# Patient Record
Sex: Female | Born: 1945
Health system: Southern US, Community
[De-identification: ages and names within clinical notes are randomized; demographics above are authoritative.]

## PROBLEM LIST (undated history)

## (undated) DIAGNOSIS — J189 Pneumonia, unspecified organism: Secondary | ICD-10-CM

## (undated) DIAGNOSIS — L659 Nonscarring hair loss, unspecified: Secondary | ICD-10-CM

## (undated) DIAGNOSIS — E559 Vitamin D deficiency, unspecified: Secondary | ICD-10-CM

## (undated) DIAGNOSIS — H269 Unspecified cataract: Secondary | ICD-10-CM

## (undated) DIAGNOSIS — K047 Periapical abscess without sinus: Secondary | ICD-10-CM

## (undated) DIAGNOSIS — H40009 Preglaucoma, unspecified, unspecified eye: Secondary | ICD-10-CM

## (undated) DIAGNOSIS — H209 Unspecified iridocyclitis: Secondary | ICD-10-CM

## (undated) DIAGNOSIS — K219 Gastro-esophageal reflux disease without esophagitis: Secondary | ICD-10-CM

## (undated) DIAGNOSIS — T7840XA Allergy, unspecified, initial encounter: Secondary | ICD-10-CM

## (undated) DIAGNOSIS — G5601 Carpal tunnel syndrome, right upper limb: Secondary | ICD-10-CM

## (undated) DIAGNOSIS — R011 Cardiac murmur, unspecified: Secondary | ICD-10-CM

## (undated) DIAGNOSIS — H35033 Hypertensive retinopathy, bilateral: Secondary | ICD-10-CM

## (undated) HISTORY — DX: Pneumonia, unspecified organism: J18.9

## (undated) HISTORY — DX: Nonscarring hair loss, unspecified: L65.9

## (undated) HISTORY — DX: Gastro-esophageal reflux disease without esophagitis: K21.9

## (undated) HISTORY — DX: Vitamin D deficiency, unspecified: E55.9

## (undated) HISTORY — DX: Hypertensive retinopathy, bilateral: H35.033

## (undated) HISTORY — DX: Allergy, unspecified, initial encounter: T78.40XA

## (undated) HISTORY — DX: Carpal tunnel syndrome, right upper limb: G56.01

## (undated) HISTORY — DX: Unspecified iridocyclitis: H20.9

## (undated) HISTORY — DX: Unspecified cataract: H26.9

## (undated) HISTORY — DX: Cardiac murmur, unspecified: R01.1

## (undated) HISTORY — DX: Preglaucoma, unspecified, unspecified eye: H40.009

---

## 1979-06-01 HISTORY — PX: APPENDECTOMY: SHX54

## 1997-05-31 HISTORY — PX: DIAGNOSTIC LAPAROSCOPY: SUR761

## 1998-05-31 HISTORY — PX: ABDOMINAL HYSTERECTOMY: SHX81

## 1998-08-26 ENCOUNTER — Other Ambulatory Visit: Admission: RE | Admit: 1998-08-26 | Discharge: 1998-08-26 | Payer: Self-pay | Admitting: Gynecology

## 1998-11-16 ENCOUNTER — Emergency Department (HOSPITAL_COMMUNITY): Admission: EM | Admit: 1998-11-16 | Discharge: 1998-11-16 | Payer: Self-pay | Admitting: Emergency Medicine

## 2001-03-17 ENCOUNTER — Encounter: Payer: Self-pay | Admitting: Gastroenterology

## 2001-03-17 ENCOUNTER — Encounter: Admission: RE | Admit: 2001-03-17 | Discharge: 2001-03-17 | Payer: Self-pay | Admitting: Gastroenterology

## 2001-06-26 ENCOUNTER — Other Ambulatory Visit: Admission: RE | Admit: 2001-06-26 | Discharge: 2001-06-26 | Payer: Self-pay | Admitting: Gynecology

## 2001-12-11 ENCOUNTER — Encounter: Payer: Self-pay | Admitting: Ophthalmology

## 2001-12-11 ENCOUNTER — Encounter: Admission: RE | Admit: 2001-12-11 | Discharge: 2001-12-11 | Payer: Self-pay | Admitting: Ophthalmology

## 2002-05-31 HISTORY — PX: BREAST SURGERY: SHX581

## 2002-07-02 ENCOUNTER — Other Ambulatory Visit: Admission: RE | Admit: 2002-07-02 | Discharge: 2002-07-02 | Payer: Self-pay | Admitting: Gynecology

## 2003-10-14 ENCOUNTER — Other Ambulatory Visit: Admission: RE | Admit: 2003-10-14 | Discharge: 2003-10-14 | Payer: Self-pay | Admitting: Gynecology

## 2004-10-13 ENCOUNTER — Other Ambulatory Visit: Admission: RE | Admit: 2004-10-13 | Discharge: 2004-10-13 | Payer: Self-pay | Admitting: Gynecology

## 2005-10-20 ENCOUNTER — Other Ambulatory Visit: Admission: RE | Admit: 2005-10-20 | Discharge: 2005-10-20 | Payer: Self-pay | Admitting: Gynecology

## 2006-05-16 LAB — HM DEXA SCAN

## 2006-10-27 ENCOUNTER — Other Ambulatory Visit: Admission: RE | Admit: 2006-10-27 | Discharge: 2006-10-27 | Payer: Self-pay | Admitting: Gynecology

## 2007-03-29 ENCOUNTER — Encounter: Admission: RE | Admit: 2007-03-29 | Discharge: 2007-03-29 | Payer: Self-pay | Admitting: General Surgery

## 2007-10-17 LAB — HM COLONOSCOPY

## 2007-10-30 ENCOUNTER — Other Ambulatory Visit: Admission: RE | Admit: 2007-10-30 | Discharge: 2007-10-30 | Payer: Self-pay | Admitting: Gynecology

## 2008-07-12 ENCOUNTER — Encounter: Admission: RE | Admit: 2008-07-12 | Discharge: 2008-07-12 | Payer: Self-pay | Admitting: Gastroenterology

## 2010-05-31 DIAGNOSIS — E559 Vitamin D deficiency, unspecified: Secondary | ICD-10-CM

## 2010-05-31 HISTORY — DX: Vitamin D deficiency, unspecified: E55.9

## 2010-12-30 LAB — HM MAMMOGRAPHY

## 2011-01-04 LAB — HM PAP SMEAR

## 2011-02-08 ENCOUNTER — Encounter: Payer: Self-pay | Admitting: Family Medicine

## 2011-02-08 ENCOUNTER — Encounter: Payer: Self-pay | Admitting: *Deleted

## 2011-02-08 ENCOUNTER — Ambulatory Visit (INDEPENDENT_AMBULATORY_CARE_PROVIDER_SITE_OTHER): Payer: BC Managed Care – PPO | Admitting: Family Medicine

## 2011-02-08 VITALS — BP 138/80 | HR 64 | Ht 66.5 in | Wt 150.0 lb

## 2011-02-08 DIAGNOSIS — R5383 Other fatigue: Secondary | ICD-10-CM

## 2011-02-08 DIAGNOSIS — Z23 Encounter for immunization: Secondary | ICD-10-CM

## 2011-02-08 DIAGNOSIS — R5381 Other malaise: Secondary | ICD-10-CM

## 2011-02-08 DIAGNOSIS — G5600 Carpal tunnel syndrome, unspecified upper limb: Secondary | ICD-10-CM | POA: Insufficient documentation

## 2011-02-08 DIAGNOSIS — M25569 Pain in unspecified knee: Secondary | ICD-10-CM

## 2011-02-08 DIAGNOSIS — Z0000 Encounter for general adult medical examination without abnormal findings: Secondary | ICD-10-CM

## 2011-02-08 DIAGNOSIS — E559 Vitamin D deficiency, unspecified: Secondary | ICD-10-CM | POA: Insufficient documentation

## 2011-02-08 DIAGNOSIS — M25561 Pain in right knee: Secondary | ICD-10-CM

## 2011-02-08 DIAGNOSIS — A6000 Herpesviral infection of urogenital system, unspecified: Secondary | ICD-10-CM | POA: Insufficient documentation

## 2011-02-08 DIAGNOSIS — E7800 Pure hypercholesterolemia, unspecified: Secondary | ICD-10-CM

## 2011-02-08 LAB — LIPID PANEL
Cholesterol: 212 mg/dL — ABNORMAL HIGH (ref 0–200)
HDL: 53 mg/dL (ref >39)
LDL Cholesterol: 131 mg/dL — ABNORMAL HIGH (ref 0–99)
Total CHOL/HDL Ratio: 4 Ratio
Triglycerides: 141 mg/dL (ref <150)
VLDL: 28 mg/dL (ref 0–40)

## 2011-02-08 LAB — CBC WITH DIFFERENTIAL/PLATELET
Basophils Absolute: 0 10*3/uL (ref 0.0–0.1)
Basophils Relative: 0 % (ref 0–1)
Eosinophils Relative: 1 % (ref 0–5)
HCT: 39.7 % (ref 36.0–46.0)
Hemoglobin: 13.5 g/dL (ref 12.0–15.0)
MCH: 31.8 pg (ref 26.0–34.0)
MCHC: 34 g/dL (ref 30.0–36.0)
MCV: 93.4 fL (ref 78.0–100.0)
Monocytes Absolute: 0.1 10*3/uL (ref 0.1–1.0)
Monocytes Relative: 2 % — ABNORMAL LOW (ref 3–12)
Neutro Abs: 4.4 10*3/uL (ref 1.7–7.7)
RDW: 13 % (ref 11.5–15.5)

## 2011-02-08 LAB — COMPREHENSIVE METABOLIC PANEL
ALT: 18 U/L (ref 0–35)
AST: 21 U/L (ref 0–37)
Alkaline Phosphatase: 78 U/L (ref 39–117)
BUN: 12 mg/dL (ref 6–23)
Calcium: 9.6 mg/dL (ref 8.4–10.5)
Chloride: 104 mEq/L (ref 96–112)
Creat: 0.77 mg/dL (ref 0.50–1.10)
Total Bilirubin: 0.6 mg/dL (ref 0.3–1.2)

## 2011-02-08 LAB — POCT URINALYSIS DIPSTICK
Blood, UA: NEGATIVE
Glucose, UA: NEGATIVE
Ketones, UA: NEGATIVE
Spec Grav, UA: 1.005
Urobilinogen, UA: NEGATIVE

## 2011-02-08 NOTE — Patient Instructions (Addendum)
HEALTH MAINTENANCE RECOMMENDATIONS:  It is recommended that you get at least 30 minutes of aerobic exercise at least 5 days/week (for weight loss, you may need as much as 60-90 minutes). This can be any activity that gets your heart rate up. This can be divided in 10-15 minute intervals if needed, but try and build up your endurance at least once a week.  Weight bearing exercise is also recommended twice weekly.  Eat a healthy diet with lots of vegetables, fruits and fiber.  "Colorful" foods have a lot of vitamins (ie green vegetables, tomatoes, red peppers, etc).  Limit sweet tea, regular sodas and alcoholic beverages, all of which has a lot of calories and sugar.  Up to 1 alcoholic drink daily may be beneficial for women (unless trying to lose weight, watch sugars).  Drink a lot of water.  Calcium recommendations are 1200-1500 mg daily (1500 mg for postmenopausal women or women without ovaries), and vitamin D 1000 IU daily.  This should be obtained from diet and/or supplements (vitamins), and calcium should not be taken all at once, but in divided doses.  Monthly self breast exams and yearly mammograms for women over the age of 68 is recommended.  Sunscreen of at least SPF 30 should be used on all sun-exposed parts of the skin when outside between the hours of 10 am and 4 pm (not just when at beach or pool, but even with exercise, golf, tennis, and yard work!)  Use a sunscreen that says "broad spectrum" so it covers both UVA and UVB rays, and make sure to reapply every 1-2 hours.  Remember to change the batteries in your smoke detectors when changing your clock times in the spring and fall.  Use your seat belt every time you are in a car, and please drive safely and not be distracted with cell phones and texting while driving.  Please check with your insurance regarding Zostavax (shingles vaccine) coverage.  If you are interested, you can call back to schedule a nurse visit
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
## 2011-03-01 ENCOUNTER — Other Ambulatory Visit (INDEPENDENT_AMBULATORY_CARE_PROVIDER_SITE_OTHER): Payer: BC Managed Care – PPO

## 2011-03-01 DIAGNOSIS — Z0000 Encounter for general adult medical examination without abnormal findings: Secondary | ICD-10-CM

## 2011-03-01 DIAGNOSIS — Z23 Encounter for immunization: Secondary | ICD-10-CM

## 2011-03-02 ENCOUNTER — Other Ambulatory Visit: Payer: BC Managed Care – PPO

## 2011-06-16 ENCOUNTER — Ambulatory Visit (INDEPENDENT_AMBULATORY_CARE_PROVIDER_SITE_OTHER): Payer: BC Managed Care – PPO | Admitting: Family Medicine

## 2011-06-16 ENCOUNTER — Encounter: Payer: Self-pay | Admitting: Family Medicine

## 2011-06-16 VITALS — BP 118/72 | HR 80 | Ht 66.5 in | Wt 150.0 lb

## 2011-06-16 DIAGNOSIS — M79609 Pain in unspecified limb: Secondary | ICD-10-CM

## 2011-06-16 DIAGNOSIS — M79604 Pain in right leg: Secondary | ICD-10-CM

## 2011-06-16 NOTE — Patient Instructions (Signed)
Trial of Aleve (1 pill twice daily, may increase to 2 pills twice daily if 1 pill isn't effective). Take it with food, and use it for up to 2 weeks, if needed. Exercise daily, and do stretches as shown daily.  Continue to drink plenty of fluids  Return if increasing pain with exertion, swelling, rash, or other new concerns develop, for re-evaluation
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
## 2011-09-04 ENCOUNTER — Emergency Department (HOSPITAL_COMMUNITY)
Admission: EM | Admit: 2011-09-04 | Discharge: 2011-09-04 | Disposition: A | Discharge status: Home / Self Care | Payer: BC Managed Care – PPO | Source: Home / Self Care | Attending: Emergency Medicine | Admitting: Emergency Medicine

## 2011-09-04 ENCOUNTER — Encounter (HOSPITAL_COMMUNITY): Payer: Self-pay | Admitting: *Deleted

## 2011-09-04 ENCOUNTER — Emergency Department (INDEPENDENT_AMBULATORY_CARE_PROVIDER_SITE_OTHER): Payer: BC Managed Care – PPO

## 2011-09-04 DIAGNOSIS — M1710 Unilateral primary osteoarthritis, unspecified knee: Secondary | ICD-10-CM

## 2011-09-04 DIAGNOSIS — IMO0002 Reserved for concepts with insufficient information to code with codable children: Secondary | ICD-10-CM

## 2011-09-04 HISTORY — DX: Periapical abscess without sinus: K04.7

## 2011-09-04 MED ORDER — MELOXICAM 7.5 MG PO TABS: 7.5000 mg | ORAL_TABLET | Freq: Every day | ORAL | Status: DC

## 2011-09-04 NOTE — ED Provider Notes (Signed)
History     CSN: [id number]  Arrival date & time 09/04/11  1615   First MD Initiated Contact with Patient 09/04/11 1619      Chief Complaint  Patient presents with  . Knee Pain  . Knee Injury    (Consider location/radiation/quality/duration/timing/severity/associated sxs/prior treatment) HPI Comments: Patient fell last week and she passed out, she describes that she was experiencing severe dental pain as he was developing an abscess was taken antibiotics. This fall occurred last Saturday she fell and hit her head and she suspected her right knee as her knee was swollen after the event (points to the medial aspect of her right knee). Patient describes that her pain has been persistent although the swelling has improved. She feels that when she straightens her knee feels somewhat better hurts more when she bends keeps pointing to medial aspect of right knee. It also hurts when he walks on it.  Patient is a 66 y.o. female presenting with knee pain. The history is provided by the patient.  Knee Pain This is a new problem. The current episode started more than 1 week ago. The problem occurs constantly. The problem has been gradually worsening. The symptoms are aggravated by bending, twisting and walking. The symptoms are relieved by rest. She has tried nothing for the symptoms. The treatment provided no relief.    Past Medical History  Diagnosis Date  . Heart murmur   . Pneumonia age 25    pt was hospitalized at age 76  . Iritis 2004, 2007    Dr. Mitzi Davenport, and [facility]; negative work-up  . Unspecified vitamin D deficiency 2012    treated by Dr. Chevis Pretty  . Genital herpes   . Carpal tunnel syndrome of right wrist   . Abscessed tooth     Past Surgical History  Procedure Date  . Diagnostic laparoscopy 1999  . Abdominal hysterectomy 2000    partial-ovaries remaining  . Appendectomy 1981  . Breast surgery 2004    breast reduction    Family History  Problem Relation Age of  Onset  . Hypertension Mother   . Diabetes Mother   . Stroke Mother   . Dementia Mother   . Cancer Mother     leukemia (chronic)  . Depression Mother   . Diabetes Father   . Hypertension Father   . Parkinsonism Father   . Hypertension Sister   . Hypertension Brother   . Stroke Brother   . Hypertension Brother   . Hyperlipidemia Brother     History  Substance Use Topics  . Smoking status: Never Smoker   . Smokeless tobacco: Never Used  . Alcohol Use: No     rarely, maybe once a year.    OB History    Grav Para Term Preterm Abortions TAB SAB Ect Mult Living                  Review of Systems  Constitutional: Negative for fever, activity change and appetite change.  Musculoskeletal: Positive for joint swelling. Negative for back pain, arthralgias and gait problem.  Neurological: Negative for weakness and numbness.    Allergies  Penicillins  Home Medications   Current Outpatient Rx  Name Route Sig Dispense Refill  . CLINDAMYCIN HCL 150 MG PO CAPS Oral Take 150 mg by mouth 3 (three) times daily.    Marland Kitchen CALCIUM CARBONATE-VITAMIN D 600-400 MG-UNIT PO TABS Oral Take 1 tablet by mouth daily.      . CENTRUM ULTRA  WOMENS PO Oral Take 1 tablet by mouth daily.      Marland Kitchen VALACYCLOVIR HCL 500 MG PO TABS Oral Take 500 mg by mouth as needed.        BP 185/83  Pulse 71  Temp(Src) 98.5 F (36.9 C) (Oral)  Resp 18  SpO2 100%  Physical Exam  Nursing note and vitals reviewed. Constitutional: She appears well-developed and well-nourished. No distress.  HENT:  Head: Normocephalic.  Eyes: Conjunctivae are normal.  Neck: Neck supple.  Musculoskeletal: She exhibits tenderness. She exhibits no edema.       Right knee: She exhibits bony tenderness. She exhibits normal range of motion, no effusion, no ecchymosis, no deformity, no erythema, normal alignment, normal meniscus and no MCL laxity. No MCL, no LCL and no patellar tendon tenderness noted.       Legs: Neurological: She is  alert.  Skin: Skin is warm. No rash noted. No erythema.    ED Course  Procedures (including critical care time)  Labs Reviewed - No data to display No results found.   No diagnosis found.    MDM  Right knee contusion and occurred 7 days ago. Patient medial knee pain exacerbated with weightbearing and direct palpation. Constitutional right knee. Symptomatic management knee sleeve followup with orthopedic provider as needed. Patient agree with treatment plan and followup care     Jimmie Molly, MD 09/04/11 1820

## 2011-09-04 NOTE — ED Notes (Signed)
Pt had syncopal episode x one week ago had abscess tooth -fell injured left side of head and right knee  With swelling post fall  --Taking pain med for abscess tooth completed pain med now pain right knee with movement and at rest

## 2011-09-04 NOTE — Discharge Instructions (Signed)
  We discussed her x-ray results today and necessary followup if your pain persists beyond 2 weeks   Wear and Tear Disorders of the Knee (Arthritis, Osteoarthritis) Everyone will experience wear and tear injuries (arthritis, osteoarthritis) of the knee. These are the changes we all get as we age. They come from the joint stress of daily living. The amount of cartilage damage in your knee and your symptoms determine if you need surgery. Mild problems require approximately two months recovery time. More severe problems take several months to recover. With mild problems, your surgeon may find worn and rough cartilage surfaces. With severe changes, your surgeon may find cartilage that has completely worn away and exposed the bone. Loose bodies of bone and cartilage, bone spurs (excess bone growth), and injuries to the menisci (cushions between the large bones of your leg) are also common. All of these problems can cause pain. For a mild wear and tear problem, rough cartilage may simply need to be shaved and smoothed. For more severe problems with areas of exposed bone, your surgeon may use an instrument for roughing up the bone surfaces to stimulate new cartilage growth. Loose bodies are usually removed. Torn menisci may be trimmed or repaired. ABOUT THE ARTHROSCOPIC PROCEDURE Arthroscopy is a surgical technique. It allows your orthopedic surgeon to diagnose and treat your knee injury with accuracy. The surgeon looks into your knee through a small scope. The scope is like a small (pencil-sized) telescope. Arthroscopy is less invasive than open knee surgery. You can expect a more rapid recovery. After the procedure, you will be moved to a recovery area until most of the effects of the medication have worn off. Your caregiver will discuss the test results with you. RECOVERY The severity of the arthritis and the type of procedure performed will determine recovery time. Other important factors include age, physical  condition, medical conditions, and the type of rehabilitation program. Strengthening your muscles after arthroscopy helps guarantee a better recovery. Follow your caregiver's instructions. Use crutches, rest, elevate, ice, and do knee exercises as instructed. Your caregivers will help you and instruct you with exercises and other physical therapy required to regain your mobility, muscle strength, and functioning following surgery. Only take over-the-counter or prescription medicines for pain, discomfort, or fever as directed by your caregiver.  SEEK MEDICAL CARE IF:   There is increased bleeding (more than a small spot) from the wound.   You notice redness, swelling, or increasing pain in the wound.   Pus is coming from wound.   You develop an unexplained oral temperature above 102 F (38.9 C) , or as your caregiver suggests.   You notice a foul smell coming from the wound or dressing.   You have severe pain with motion of the knee.  SEEK IMMEDIATE MEDICAL CARE IF:   You develop a rash.   You have difficulty breathing.   You have any allergic problems.  MAKE SURE YOU:   Understand these instructions.   Will watch your condition.   Will get help right away if you are not doing well or get worse.  Document Released: 05/14/2000 Document Revised: 05/06/2011 Document Reviewed: 10/11/2007 [facility] Patient Information 2012 [place], [place].

## 2011-09-08 ENCOUNTER — Encounter (HOSPITAL_COMMUNITY): Payer: Self-pay | Admitting: Cardiology

## 2011-09-08 ENCOUNTER — Emergency Department (INDEPENDENT_AMBULATORY_CARE_PROVIDER_SITE_OTHER)
Admission: EM | Admit: 2011-09-08 | Discharge: 2011-09-08 | Disposition: A | Discharge status: Home / Self Care | Payer: BC Managed Care – PPO | Source: Home / Self Care | Attending: Emergency Medicine | Admitting: Emergency Medicine

## 2011-09-08 DIAGNOSIS — T148XXA Other injury of unspecified body region, initial encounter: Secondary | ICD-10-CM

## 2011-09-08 NOTE — ED Provider Notes (Signed)
Chief Complaint  Patient presents with  . Joint Swelling    History of Present Illness:   Annette Monroe is a 66 year old female who noted swelling of her right ankle today. She denies any pain in the ankle or difficulty walking. She injured her medial right knee 10 days ago. She was having some severe dental pain and actually passed out. She was given some antibiotics, but did bang her knee and came here 5 days ago. The knee was x-rayed and this was negative. She was given anti-inflammatories and put in a knee sleeve which she thought was very tight. The knee is now better, but she noted the swelling in the ankle today.  Review of Systems:  Other than noted above, the patient denies any of the following symptoms: Systemic:  No fevers, chills, sweats, or aches.  No fatigue or tiredness. Musculoskeletal:  No joint pain, arthritis, bursitis, swelling, back pain, or neck pain. Neurological:  No muscular weakness, paresthesias, headache, or trouble with speech or coordination.  No dizziness.   PMFSH:  Past medical history, family history, social history, meds, and allergies were reviewed.  Physical Exam:   Vital signs:  BP 148/83  Pulse 78  Temp(Src) 98.6 F (37 C) (Oral)  Resp 16  SpO2 98% Gen:  Alert and oriented times 3.  In no distress. Musculoskeletal: Exam of the knee reveals no swelling, bruising, or deformity. The knee has a full range of motion without any pain. There is a little bit of crepitus. McMurray sign is negative, cruciate and collateral ligaments are intact. Exam of the ankle reveals a little bit of puffiness and some discoloration both medially and laterally, this extends to the foot. There is no erythema or warmth. There is absolutely no tenderness to palpation over the ankle and ankle joint has a full range of motion without any pain. Otherwise, all joints had a full a ROM with no swelling, bruising or deformity.  No edema, pulses full. Extremities were warm and pink.  Capillary  refill was brisk.  Skin:  Clear, warm and dry.  No rash. Neuro:  Alert and oriented times 3.  Muscle strength was normal.  Sensation was intact to light touch.   Medical Decision Making:  I think the swelling of the ankle and discoloration is due to bruising from the knee does travel down the leg and in the ankle. I think it will go away with time and all she needs to do is elevated and wear an Ace wrap. He is fairly asymptomatic in terms of any pain or discomfort, so I don't think she needs any further medication.  Assessment:  The encounter diagnosis was Hematoma.  Plan:   1.  The following meds were prescribed:   New Prescriptions   No medications on file   2.  The patient was instructed in symptomatic care, including rest and activity, elevation, application of ice and compression.  Appropriate handouts were given. 3.  The patient was told to return if becoming worse in any way, if no better in 3 or 4 days, and given some red flag symptoms that would indicate earlier return.   4.  The patient was told to follow up back here if she has any further problems such as increasing pain, erythema, increasing swelling, or any other problems.   Reuben Likes, MD 09/08/11 [phone]

## 2011-09-08 NOTE — Discharge Instructions (Signed)
Bone Bruise  A bone bruise is a small hidden fracture of the bone. It typically occurs with bones located close to the surface of the skin.  SYMPTOMS  The pain lasts longer than a normal bruise.   The bruised area is difficult to use.   There may be discoloration or swelling of the bruised area.   When a bone bruise is found with injury to the anterior cruciate ligament (in the knee) there is often an increased:   Amount of fluid in the knee   Time the fluid in the knee lasts.   Number of days until you are walking normally and regaining the motion you had before the injury.   Number of days with pain from the injury.  DIAGNOSIS  It can only be seen on X-rays known as MRIs. This stands for magnetic resonance imaging. A regular X-ray taken of a bone bruise would appear to be normal. A bone bruise is a common injury in the knee and the heel bone (calcaneus). The problems are similar to those produced by stress fractures, which are bone injuries caused by overuse. A bone bruise may also be a sign of other injuries. For example, bone bruises are commonly found where an anterior cruciate ligament (ACL) in the knee has been pulled away from the bone (ruptured). A ligament is a tough fibrous material that connects bones together to make our joints stable. Bruises of the bone last a lot longer than bruises of the muscle or tissues beneath the skin. Bone bruises can last from days to months and are often more severe and painful than other bruises. TREATMENT Because bone bruises are sudden injuries you cannot often prevent them, other than by being extremely careful. Some things you can do to improve the condition are:  Apply ice to the sore area for 15 to 20 minutes, 3 to 4 times per day while awake for the first 2 days. Put the ice in a plastic bag, and place a towel between the bag of ice and your skin.   Keep your bruised area raised (elevated) when possible to lessen swelling.   For activity:     Use crutches when necessary; do not put weight on the injured leg until you are no longer tender.   You may walk on your affected part as the pain allows, or as instructed.   Start weight bearing gradually on the bruised part.   Continue to use crutches or a cane until you can stand without causing pain, or as instructed.   If a plaster splint was applied, wear the splint until you are seen for a follow-up examination. Rest it on nothing harder than a pillow the first 24 hours. Do not put weight on it. Do not get it wet. You may take it off to take a shower or bath.   If an air splint was applied, more air may be blown into or out of the splint as needed for comfort. You may take it off at night and to take a shower or bath.   Wiggle your toes in the splint several times per day if you are able.   You may have been given an elastic bandage to use with the plaster splint or alone. The splint is too tight if you have numbness, tingling or if your foot becomes cold and blue. Adjust the bandage to make it comfortable.   Only take over-the-counter or prescription medicines for pain, discomfort, or fever as directed by   your caregiver.   Follow all instructions for follow up with your caregiver. This includes any orthopedic referrals, physical therapy, and rehabilitation. Any delay in obtaining necessary care could result in a delay or failure of the bones to heal.  SEEK MEDICAL CARE IF:   You have an increase in bruising, swelling, or pain.   You notice coldness of your toes.   You do not get pain relief with medications.  SEEK IMMEDIATE MEDICAL CARE IF:   Your toes are numb or blue.   You have severe pain not controlled with medications.   If any of the problems that caused you to seek care are becoming worse.  Document Released: 08/07/2003 Document Revised: 05/06/2011 Document Reviewed: 12/20/2007 [facility] Patient Information 2012 [place], [place].Hematoma A hematoma is a pocket of  blood that collects under the skin, in an organ, in a body space, in a joint space, or in other tissue. The blood can clot to form a lump that you can see and feel. The lump is often firm, sore, and sometimes even painful and tender. Most hematomas get better in a few days to weeks. However, some hematomas may be serious and require medical care.Hematomas can range in size from very small to very large. CAUSES  A hematoma can be caused by a blunt or penetrating injury. It can also be caused by leakage from a blood vessel under the skin. Spontaneous leakage from a blood vessel is more likely to occur in elderly people, especially those taking blood thinners. Sometimes, a hematoma can develop after certain medical procedures. SYMPTOMS  Unlike a bruise, a hematoma forms a firm lump that you can feel. This lump is the collection of blood. The collection of blood can also cause your skin to turn a blue to dark blue color. If the hematoma is close to the surface of the skin, it often produces a yellowish color in the skin. DIAGNOSIS  Your caregiver can determine whether you have a hematoma based on your history and a physical exam. TREATMENT  Hematomas usually go away on their own over time. Rarely does the blood need to be drained out of the body. HOME CARE INSTRUCTIONS   Put ice on the injured area.   Put ice in a plastic bag.   Place a towel between your skin and the bag.   Leave the ice on for 15 to 20 minutes, 3 to 4 times a day for the first 1 to 2 days.   After the first 2 days, switch to using warm compresses on the hematoma.   Elevate the injured area to help decrease pain and swelling. Wrapping the area with an elastic bandage may also be helpful. Compression helps to reduce swelling and promotes shrinking of the hematoma. Make sure the bandage is not wrapped too tight.   If your hematoma is on a lower extremity and is painful, crutches may be helpful for a couple days.   Only take  over-the-counter or prescription medicines for pain, discomfort, or fever as directed by your caregiver. Most patients can take acetaminophen or ibuprofen for the pain.  SEEK IMMEDIATE MEDICAL CARE IF:   You have increasing pain, or your pain is not controlled with medicine.   You have a fever.   You have worsening swelling or discoloration.   Your skin over the hematoma breaks or starts bleeding.  MAKE SURE YOU:   Understand these instructions.   Will watch your condition.   Will get help right away if  you are not doing well or get worse.  Document Released: 12/30/2003 Document Revised: 05/06/2011 Document Reviewed: 01/18/2011 [facility] Patient Information 2012 [place], [place].

## 2011-09-08 NOTE — ED Notes (Signed)
Pt reports right ankle swelling that started today. She had a fall with injury to her right knee this past Saturday. Pt denies pain to the ankle but it feels tingly in the ankle area. Swelling noted to be on the medial side of the right ankle. Denies calf tenderness.

## 2011-12-30 LAB — HM MAMMOGRAPHY: HM Mammogram: NORMAL

## 2011-12-30 LAB — HM PAP SMEAR: HM Pap smear: NORMAL

## 2012-01-11 ENCOUNTER — Encounter: Payer: Self-pay | Admitting: Family Medicine

## 2012-01-11 ENCOUNTER — Ambulatory Visit (INDEPENDENT_AMBULATORY_CARE_PROVIDER_SITE_OTHER): Payer: BC Managed Care – PPO | Admitting: Family Medicine

## 2012-01-11 VITALS — BP 114/70 | HR 72 | Wt 149.0 lb

## 2012-01-11 DIAGNOSIS — M67361 Transient synovitis, right knee: Secondary | ICD-10-CM

## 2012-01-11 DIAGNOSIS — M659 Synovitis and tenosynovitis, unspecified: Secondary | ICD-10-CM

## 2012-01-11 NOTE — Patient Instructions (Signed)
Take 2 Aleve twice per day. Come back here in 2 weeks. If you have trouble before that don't hesitate to call

## 2012-01-11 NOTE — Progress Notes (Signed)
  Subjective:    Patient ID: Annette Monroe, female    DOB: 18-Jul-1945, 65 y.o.   MRN: [id number]  HPI Approximately one week ago she woke up and noted some knee discomfort as well as swelling. No history of injury to the knee. No popping, locking or grinding. She has no difficulty sitting or going up or down steps. She did try Aleve 3 times a day for several days and did get some relief. She stopped the medication and now the symptoms are recurring. She does have a trip planned to [place] in September.   Review of Systems     Objective:   Physical Exam Small effusion is noted. No tenderness to palpation over the joint lines. McMurray's and anterior drawer negative. No tenderness palpation of the patella.       Assessment & Plan:   1. Transient synovitis of right knee    I will treat conservatively with 2 Aleve twice per day and return here in 2 weeks for recheck.

## 2012-01-18 ENCOUNTER — Encounter: Payer: Self-pay | Admitting: Internal Medicine

## 2012-01-26 ENCOUNTER — Ambulatory Visit
Admission: RE | Admit: 2012-01-26 | Discharge: 2012-01-26 | Disposition: A | Discharge status: Home / Self Care | Payer: BC Managed Care – PPO | Source: Ambulatory Visit | Attending: Family Medicine | Admitting: Family Medicine

## 2012-01-26 ENCOUNTER — Ambulatory Visit (INDEPENDENT_AMBULATORY_CARE_PROVIDER_SITE_OTHER): Payer: BC Managed Care – PPO | Admitting: Family Medicine

## 2012-01-26 VITALS — BP 110/70 | HR 66 | Wt 150.0 lb

## 2012-01-26 DIAGNOSIS — M25461 Effusion, right knee: Secondary | ICD-10-CM

## 2012-01-26 DIAGNOSIS — M25469 Effusion, unspecified knee: Secondary | ICD-10-CM

## 2012-01-26 NOTE — Progress Notes (Signed)
  Subjective:    Patient ID: Annette Monroe, female    DOB: 1945-06-26, 66 y.o.   MRN: [id number]  HPI She is here for recheck. She states that she is still having difficulty with swelling and discomfort but no popping locking or grinding. She now remembers that she indeed did have a previous history and was seen in the emergency room in April. That record was reviewed. An x-ray was taken at that time. Presently she does not complain of any other joints involved, redness, warmth or tenderness to palpation.   Review of Systems     Objective:   Physical Exam Effusion is noted in the right knee. It is not warm or tender. Full motion. No joint line tenderness. Negative anterior drawer and McMurray's testing.       Assessment & Plan:   1. Knee effusion, right  DG Knee Complete 4 Views Right   the x-ray did show an effusion with some chronic arthritic changes and an osteochondral lesion. I will refer her to orthopedics.

## 2012-01-28 ENCOUNTER — Telehealth: Payer: Self-pay | Admitting: Family Medicine

## 2012-01-28 NOTE — Telephone Encounter (Signed)
Patient called inquiring about her ortho. Referral in reference to her knee. CLS

## 2012-02-01 NOTE — Telephone Encounter (Signed)
SENT REFERRAL TO [facility]

## 2012-02-14 ENCOUNTER — Telehealth: Payer: Self-pay | Admitting: Family Medicine

## 2012-02-15 ENCOUNTER — Other Ambulatory Visit (INDEPENDENT_AMBULATORY_CARE_PROVIDER_SITE_OTHER): Payer: BC Managed Care – PPO

## 2012-02-15 DIAGNOSIS — Z23 Encounter for immunization: Secondary | ICD-10-CM

## 2012-02-15 MED ORDER — CIPROFLOXACIN HCL 500 MG PO TABS: 500.0000 mg | ORAL_TABLET | Freq: Two times a day (BID) | ORAL | Status: DC

## 2012-02-15 NOTE — Telephone Encounter (Signed)
done

## 2012-04-12 ENCOUNTER — Encounter: Payer: Self-pay | Admitting: Family Medicine

## 2012-04-12 ENCOUNTER — Ambulatory Visit (INDEPENDENT_AMBULATORY_CARE_PROVIDER_SITE_OTHER): Payer: BC Managed Care – PPO | Admitting: Family Medicine

## 2012-04-12 VITALS — BP 132/88 | HR 64 | Ht 66.5 in | Wt 155.0 lb

## 2012-04-12 DIAGNOSIS — M5412 Radiculopathy, cervical region: Secondary | ICD-10-CM

## 2012-04-12 DIAGNOSIS — M25519 Pain in unspecified shoulder: Secondary | ICD-10-CM

## 2012-04-12 DIAGNOSIS — M25511 Pain in right shoulder: Secondary | ICD-10-CM

## 2012-04-12 MED ORDER — NAPROXEN 500 MG PO TABS: 500.0000 mg | ORAL_TABLET | Freq: Two times a day (BID) | ORAL | Status: DC
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
## 2012-04-12 NOTE — Patient Instructions (Addendum)
Cervical Radiculopathy Cervical radiculopathy happens when a nerve in the neck is pinched or bruised by a slipped (herniated) disk or by arthritic changes in the bones of the cervical spine. This can occur due to an injury or as part of the normal aging process. Pressure on the cervical nerves can cause pain or numbness that runs from your neck all the way down into your arm and fingers. CAUSES  There are many possible causes, including:  Injury.  Muscle tightness in the neck from overuse.  Swollen, painful joints (arthritis).  Breakdown or degeneration in the bones and joints of the spine (spondylosis) due to aging.  Bone spurs that may develop near the cervical nerves. SYMPTOMS  Symptoms include pain, weakness, or numbness in the affected arm and hand. Pain can be severe or irritating. Symptoms may be worse when extending or turning the neck. DIAGNOSIS  Your caregiver will ask about your symptoms and do a physical exam. He or she may test your strength and reflexes. X-rays, CT scans, and MRI scans may be needed in cases of injury or if the symptoms do not go away after a period of time. Electromyography (EMG) or nerve conduction testing may be done to study how your nerves and muscles are working. TREATMENT  Your caregiver may recommend certain exercises to help relieve your symptoms. Cervical radiculopathy can, and often does, get better with time and treatment. If your problems continue, treatment options may include:  Wearing a soft collar for short periods of time.  Physical therapy to strengthen the neck muscles.  Medicines, such as nonsteroidal anti-inflammatory drugs (NSAIDs), oral corticosteroids, or spinal injections.  Surgery. Different types of surgery may be done depending on the cause of your problems. HOME CARE INSTRUCTIONS   Put ice on the affected area.  Put ice in a plastic bag.  Place a towel between your skin and the bag.  Leave the ice on for 15 to 20  minutes, 3 to 4 times a day or as directed by your caregiver.  If ice does not help, you can try using heat. Take a warm shower or bath, or use a hot water bottle as directed by your caregiver.  You may try a gentle neck and shoulder massage.  Use a flat pillow when you sleep.  Only take over-the-counter or prescription medicines for pain, discomfort, or fever as directed by your caregiver.  If physical therapy was prescribed, follow your caregiver's directions.  If a soft collar was prescribed, use it as directed. SEEK IMMEDIATE MEDICAL CARE IF:   Your pain gets much worse and cannot be controlled with medicines.  You have weakness or numbness in your hand, arm, face, or leg.  You have a high fever or a stiff, rigid neck.  You lose bowel or bladder control (incontinence).  You have trouble with walking, balance, or speaking. MAKE SURE YOU:   Understand these instructions.  Will watch your condition.  Will get help right away if you are not doing well or get worse. Document Released: 02/09/2001 Document Revised: 08/09/2011 Document Reviewed: 12/29/2010 [facility] Patient Information 2013 [place], [place].  Follow up in 2-3 weeks if not improving, sooner if worse.  Definitely let us know if you develop weakness, rash, or other concerns.  Take the naproxen with food. Call if you do not tolerate this medication.

## 2012-07-12 ENCOUNTER — Ambulatory Visit (INDEPENDENT_AMBULATORY_CARE_PROVIDER_SITE_OTHER): Payer: BC Managed Care – PPO | Admitting: Family Medicine

## 2012-07-12 ENCOUNTER — Encounter: Payer: Self-pay | Admitting: Family Medicine

## 2012-07-12 VITALS — BP 130/80 | HR 64 | Ht 67.0 in | Wt 158.0 lb

## 2012-07-12 DIAGNOSIS — R002 Palpitations: Secondary | ICD-10-CM | POA: Insufficient documentation

## 2012-07-12 DIAGNOSIS — E039 Hypothyroidism, unspecified: Secondary | ICD-10-CM

## 2012-07-12 DIAGNOSIS — R5383 Other fatigue: Secondary | ICD-10-CM

## 2012-07-12 DIAGNOSIS — L659 Nonscarring hair loss, unspecified: Secondary | ICD-10-CM

## 2012-07-12 DIAGNOSIS — R079 Chest pain, unspecified: Secondary | ICD-10-CM

## 2012-07-12 DIAGNOSIS — E559 Vitamin D deficiency, unspecified: Secondary | ICD-10-CM

## 2012-07-12 DIAGNOSIS — Z0000 Encounter for general adult medical examination without abnormal findings: Secondary | ICD-10-CM

## 2012-07-12 DIAGNOSIS — M1711 Unilateral primary osteoarthritis, right knee: Secondary | ICD-10-CM | POA: Insufficient documentation

## 2012-07-12 LAB — CBC WITH DIFFERENTIAL/PLATELET
Basophils Absolute: 0 10*3/uL (ref 0.0–0.1)
Eosinophils Relative: 1 % (ref 0–5)
HCT: 40.1 % (ref 36.0–46.0)
Hemoglobin: 13.8 g/dL (ref 12.0–15.0)
Lymphocytes Relative: 37 % (ref 12–46)
MCV: 92 fL (ref 78.0–100.0)
Monocytes Absolute: 0.4 10*3/uL (ref 0.1–1.0)
Monocytes Relative: 6 % (ref 3–12)
Neutro Abs: 3.7 10*3/uL (ref 1.7–7.7)
RDW: 13.3 % (ref 11.5–15.5)
WBC: 6.7 10*3/uL (ref 4.0–10.5)

## 2012-07-12 LAB — COMPREHENSIVE METABOLIC PANEL
AST: 21 U/L (ref 0–37)
Albumin: 4.2 g/dL (ref 3.5–5.2)
BUN: 13 mg/dL (ref 6–23)
Calcium: 9.7 mg/dL (ref 8.4–10.5)
Chloride: 104 mEq/L (ref 96–112)
Glucose, Bld: 76 mg/dL (ref 70–99)
Potassium: 4 mEq/L (ref 3.5–5.3)
Sodium: 139 mEq/L (ref 135–145)
Total Protein: 6.6 g/dL (ref 6.0–8.3)

## 2012-07-12 LAB — POCT URINALYSIS DIPSTICK
Bilirubin, UA: NEGATIVE
Blood, UA: NEGATIVE
Glucose, UA: NEGATIVE
Spec Grav, UA: 1.01

## 2012-07-12 LAB — LIPID PANEL
HDL: 54 mg/dL (ref >39)
LDL Cholesterol: 111 mg/dL — ABNORMAL HIGH (ref 0–99)
Triglycerides: 111 mg/dL (ref <150)

## 2012-07-12 LAB — TSH: TSH: 4.038 u[IU]/mL (ref 0.350–4.500)
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
## 2012-07-12 NOTE — Patient Instructions (Signed)
HEALTH MAINTENANCE RECOMMENDATIONS:  It is recommended that you get at least 30 minutes of aerobic exercise at least 5 days/week (for weight loss, you may need as much as 60-90 minutes). This can be any activity that gets your heart rate up. This can be divided in 10-15 minute intervals if needed, but try and build up your endurance at least once a week.  Weight bearing exercise is also recommended twice weekly.  Eat a healthy diet with lots of vegetables, fruits and fiber.  "Colorful" foods have a lot of vitamins (ie green vegetables, tomatoes, red peppers, etc).  Limit sweet tea, regular sodas and alcoholic beverages, all of which has a lot of calories and sugar.  Up to 1 alcoholic drink daily may be beneficial for women (unless trying to lose weight, watch sugars).  Drink a lot of water.  Calcium recommendations are 1200-1500 mg daily (1500 mg for postmenopausal women or women without ovaries), and vitamin D 1000 IU daily.  This should be obtained from diet and/or supplements (vitamins), and calcium should not be taken all at once, but in divided doses.  Monthly self breast exams and yearly mammograms for women over the age of 27 is recommended.  Sunscreen of at least SPF 30 should be used on all sun-exposed parts of the skin when outside between the hours of 10 am and 4 pm (not just when at beach or pool, but even with exercise, golf, tennis, and yard work!)  Use a sunscreen that says "broad spectrum" so it covers both UVA and UVB rays, and make sure to reapply every 1-2 hours.  Remember to change the batteries in your smoke detectors when changing your clock times in the spring and fall.  Use your seat belt every time you are in a car, and please drive safely and not be distracted with cell phones and texting while driving.   check with your insurance regarding coverage of Zostavax (shingles vaccine); call back to schedule nurse visit if interested.  Keep journal regarding chest symptoms--as  to whether symptoms are related to meals, certain foods, exertion/exercise.  Depending on what you find, may need further evaluation.  If exertional, need referral to cardiologist, stress test.  If related to foods--cut back on citrus/acidic foods, trial of prilosec x 2 weeks to see if symptoms resolve.  Follow up here within the month if persistent/ongoing symptoms  R knee pain--known arthritis.  Return if worsening pain for a cortisone shot.  For now, continue with anti-inflammatories as needed. Avoid exercises that increase pain.  Exercise bike, elliptical and water aerobics are less likely to cause pain (if you can't tolerate walking for exercise)

## 2012-07-14 ENCOUNTER — Other Ambulatory Visit: Payer: Self-pay | Admitting: Family Medicine

## 2012-07-14 ENCOUNTER — Telehealth: Payer: Self-pay | Admitting: Internal Medicine

## 2012-07-14 DIAGNOSIS — E559 Vitamin D deficiency, unspecified: Secondary | ICD-10-CM

## 2012-07-14 DIAGNOSIS — E039 Hypothyroidism, unspecified: Secondary | ICD-10-CM

## 2012-07-14 MED ORDER — ERGOCALCIFEROL 1.25 MG (50000 UT) PO CAPS: 50000.0000 [IU] | ORAL_CAPSULE | ORAL | Status: DC

## 2012-07-14 NOTE — Telephone Encounter (Signed)
Sent in 50,000 IU of Vitamin D for 8 weeks for pt

## 2012-08-07 ENCOUNTER — Other Ambulatory Visit: Payer: BC Managed Care – PPO

## 2012-08-28 ENCOUNTER — Encounter: Payer: Self-pay | Admitting: Family Medicine

## 2012-08-28 ENCOUNTER — Ambulatory Visit (INDEPENDENT_AMBULATORY_CARE_PROVIDER_SITE_OTHER): Payer: BC Managed Care – PPO | Admitting: Family Medicine

## 2012-08-28 VITALS — BP 128/82 | HR 68 | Temp 98.1°F | Ht 66.5 in | Wt 158.0 lb

## 2012-08-28 DIAGNOSIS — M545 Low back pain: Secondary | ICD-10-CM

## 2012-08-28 LAB — POCT URINALYSIS DIPSTICK
Leukocytes, UA: NEGATIVE
Protein, UA: NEGATIVE
Spec Grav, UA: 1.01
Urobilinogen, UA: NEGATIVE
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
## 2012-08-28 NOTE — Patient Instructions (Signed)
Low back pain, left-sided:  consistent with muscle spasm.  No evidence of pneumonia or kidney/bladder infection 2 Aleve with food twice daily (or 1 500mg  if you have any leftover prescription).  Take regular for 5-7 days or up to 10 days.  Try heat, massage and stretches Call for muscle relaxant prescription if ongoing muscle pain.  Let us know if pain is mostly during day or evening, to let us know if you want medication that is less sedating (ie Robaxin) vs more sedating (ie Flexeril).

## 2012-10-05 ENCOUNTER — Other Ambulatory Visit: Payer: BC Managed Care – PPO

## 2012-10-30 ENCOUNTER — Other Ambulatory Visit: Payer: BC Managed Care – PPO

## 2012-10-30 DIAGNOSIS — E039 Hypothyroidism, unspecified: Secondary | ICD-10-CM

## 2012-10-30 DIAGNOSIS — E559 Vitamin D deficiency, unspecified: Secondary | ICD-10-CM

## 2012-10-30 LAB — TSH: TSH: 3.953 u[IU]/mL (ref 0.350–4.500)

## 2013-01-04 ENCOUNTER — Other Ambulatory Visit (INDEPENDENT_AMBULATORY_CARE_PROVIDER_SITE_OTHER): Payer: BC Managed Care – PPO

## 2013-01-04 DIAGNOSIS — Z2911 Encounter for prophylactic immunotherapy for respiratory syncytial virus (RSV): Secondary | ICD-10-CM

## 2013-01-04 DIAGNOSIS — Z23 Encounter for immunization: Secondary | ICD-10-CM

## 2013-01-08 ENCOUNTER — Telehealth: Payer: Self-pay | Admitting: Internal Medicine

## 2013-01-08 NOTE — Telephone Encounter (Signed)
Pt has a shingle shot last week and states that the first day it was red and swollen and then over the weekend she states her arm aches and its sensitive and hard around the area. Pt did try to rub acholol on her spot to see if she could get some relief and would have some itching to it. Pt wants to know if this is common or what she should do

## 2013-01-08 NOTE — Telephone Encounter (Signed)
Pt aware.

## 2013-01-08 NOTE — Telephone Encounter (Signed)
Reassure her that this is not unusual and to use tylenol for reliefl

## 2013-03-19 ENCOUNTER — Other Ambulatory Visit (INDEPENDENT_AMBULATORY_CARE_PROVIDER_SITE_OTHER): Payer: Medicare Other

## 2013-03-19 DIAGNOSIS — Z23 Encounter for immunization: Secondary | ICD-10-CM | POA: Diagnosis not present

## 2013-04-10 DIAGNOSIS — H2510 Age-related nuclear cataract, unspecified eye: Secondary | ICD-10-CM | POA: Diagnosis not present

## 2013-05-09 DIAGNOSIS — H2510 Age-related nuclear cataract, unspecified eye: Secondary | ICD-10-CM | POA: Diagnosis not present

## 2013-08-20 ENCOUNTER — Ambulatory Visit (INDEPENDENT_AMBULATORY_CARE_PROVIDER_SITE_OTHER): Payer: Medicare Other | Admitting: Family Medicine

## 2013-08-20 ENCOUNTER — Encounter: Payer: Self-pay | Admitting: Family Medicine

## 2013-08-20 VITALS — BP 124/76 | HR 68 | Ht 67.0 in | Wt 151.0 lb

## 2013-08-20 DIAGNOSIS — R7989 Other specified abnormal findings of blood chemistry: Secondary | ICD-10-CM

## 2013-08-20 DIAGNOSIS — J309 Allergic rhinitis, unspecified: Secondary | ICD-10-CM

## 2013-08-20 DIAGNOSIS — R946 Abnormal results of thyroid function studies: Secondary | ICD-10-CM

## 2013-08-20 DIAGNOSIS — G253 Myoclonus: Secondary | ICD-10-CM

## 2013-08-20 DIAGNOSIS — Z0000 Encounter for general adult medical examination without abnormal findings: Secondary | ICD-10-CM | POA: Diagnosis not present

## 2013-08-20 DIAGNOSIS — E559 Vitamin D deficiency, unspecified: Secondary | ICD-10-CM | POA: Diagnosis not present

## 2013-08-20 DIAGNOSIS — Z23 Encounter for immunization: Secondary | ICD-10-CM | POA: Diagnosis not present

## 2013-08-20 DIAGNOSIS — E7800 Pure hypercholesterolemia, unspecified: Secondary | ICD-10-CM

## 2013-08-20 DIAGNOSIS — Z79899 Other long term (current) drug therapy: Secondary | ICD-10-CM

## 2013-08-20 LAB — POCT URINALYSIS DIPSTICK
BILIRUBIN UA: NEGATIVE
Blood, UA: NEGATIVE
Glucose, UA: NEGATIVE
Ketones, UA: NEGATIVE
LEUKOCYTES UA: NEGATIVE
NITRITE UA: NEGATIVE
PH UA: 5
PROTEIN UA: NEGATIVE
Spec Grav, UA: 1.01
Urobilinogen, UA: NEGATIVE

## 2013-08-20 NOTE — Patient Instructions (Addendum)
  HEALTH MAINTENANCE RECOMMENDATIONS:  It is recommended that you get at least 30 minutes of aerobic exercise at least 5 days/week (for weight loss, you may need as much as 60-90 minutes). This can be any activity that gets your heart rate up. This can be divided in 10-15 minute intervals if needed, but try and build up your endurance at least once a week.  Weight bearing exercise is also recommended twice weekly.  Eat a healthy diet with lots of vegetables, fruits and fiber.  "Colorful" foods have a lot of vitamins (ie green vegetables, tomatoes, red peppers, etc).  Limit sweet tea, regular sodas and alcoholic beverages, all of which has a lot of calories and sugar.  Up to 1 alcoholic drink daily may be beneficial for women (unless trying to lose weight, watch sugars).  Drink a lot of water.  Calcium recommendations are 1200-1500 mg daily (1500 mg for postmenopausal women or women without ovaries), and vitamin D 1000 IU daily.  This should be obtained from diet and/or supplements (vitamins), and calcium should not be taken all at once, but in divided doses.  Monthly self breast exams and yearly mammograms for women over the age of 70 is recommended.  Sunscreen of at least SPF 30 should be used on all sun-exposed parts of the skin when outside between the hours of 10 am and 4 pm (not just when at beach or pool, but even with exercise, golf, tennis, and yard work!)  Use a sunscreen that says "broad spectrum" so it covers both UVA and UVB rays, and make sure to reapply every 1-2 hours.  Remember to change the batteries in your smoke detectors when changing your clock times in the spring and fall.  Use your seat belt every time you are in a car, and please drive safely and not be distracted with cell phones and texting while driving.  Consider adding decongestant (ie sudafed, vs claritin D) if the plain claritin isn't adequate to control your allergies.  If this causes increased palpitations, then  consider using Flonase every day (nasal steroid).  Allergen avoidance (keep windows closed)  Trial of Rogaine for Women; consider following up with Dr. Allyson Sabal if worsening areas of hair loss.
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
## 2013-08-21 LAB — LIPID PANEL
CHOLESTEROL: 209 mg/dL — AB (ref 0–200)
HDL: 60 mg/dL (ref >39)
LDL Cholesterol: 128 mg/dL — ABNORMAL HIGH (ref 0–99)
Total CHOL/HDL Ratio: 3.5 Ratio
Triglycerides: 107 mg/dL (ref <150)
VLDL: 21 mg/dL (ref 0–40)

## 2013-08-21 LAB — COMPREHENSIVE METABOLIC PANEL
ALBUMIN: 4.1 g/dL (ref 3.5–5.2)
ALT: 14 U/L (ref 0–35)
AST: 16 U/L (ref 0–37)
Alkaline Phosphatase: 94 U/L (ref 39–117)
BUN: 14 mg/dL (ref 6–23)
CALCIUM: 9.7 mg/dL (ref 8.4–10.5)
CHLORIDE: 101 meq/L (ref 96–112)
CO2: 26 meq/L (ref 19–32)
Creat: 0.79 mg/dL (ref 0.50–1.10)
Glucose, Bld: 87 mg/dL (ref 70–99)
Potassium: 4 mEq/L (ref 3.5–5.3)
Sodium: 138 mEq/L (ref 135–145)
Total Bilirubin: 0.7 mg/dL (ref 0.2–1.2)
Total Protein: 6.7 g/dL (ref 6.0–8.3)

## 2013-08-21 LAB — TSH: TSH: 4.055 u[IU]/mL (ref 0.350–4.500)

## 2013-08-21 LAB — VITAMIN D 25 HYDROXY (VIT D DEFICIENCY, FRACTURES): Vit D, 25-Hydroxy: 32 ng/mL (ref 30–89)

## 2013-08-29 ENCOUNTER — Other Ambulatory Visit (INDEPENDENT_AMBULATORY_CARE_PROVIDER_SITE_OTHER): Payer: Medicare Other

## 2013-08-29 DIAGNOSIS — Z0000 Encounter for general adult medical examination without abnormal findings: Secondary | ICD-10-CM

## 2013-08-29 DIAGNOSIS — Z1211 Encounter for screening for malignant neoplasm of colon: Secondary | ICD-10-CM

## 2013-08-29 LAB — POC HEMOCCULT BLD/STL (HOME/3-CARD/SCREEN)
Card #2 Fecal Occult Blod, POC: NEGATIVE
FECAL OCCULT BLD: NEGATIVE
Fecal Occult Blood, POC: NEGATIVE

## 2013-10-26 ENCOUNTER — Encounter: Payer: Self-pay | Admitting: Medical

## 2013-10-26 ENCOUNTER — Ambulatory Visit (INDEPENDENT_AMBULATORY_CARE_PROVIDER_SITE_OTHER): Payer: Medicare Other | Admitting: Medical

## 2013-10-26 VITALS — BP 132/80 | HR 76 | Temp 97.8°F | Resp 14 | Wt 152.0 lb

## 2013-10-26 DIAGNOSIS — R51 Headache: Secondary | ICD-10-CM | POA: Diagnosis not present

## 2013-10-26 DIAGNOSIS — W57XXXA Bitten or stung by nonvenomous insect and other nonvenomous arthropods, initial encounter: Secondary | ICD-10-CM

## 2013-10-26 DIAGNOSIS — T148 Other injury of unspecified body region: Secondary | ICD-10-CM | POA: Diagnosis not present

## 2013-10-26 NOTE — Progress Notes (Signed)
Subjective: Was working in yard this past week, has raised deck, and as she was walking out, felt something sting her on the bridge of her nose.   Later when she went in her house, it became more painful and red.  Used anti-itch cream on it.  Since then it has continued to spread down the nose.   Has had persistent headache since 3 days ago.  Taking Aleve for this.  right eye is achy too.  She is worried about spider bite.  Doesn't think a bee stung her.  Denies fever, no NVD, no other URI symptoms, no other rash, no other symptom.  Takes Claritin daily for allergies.  No vision changes, no eye drainage.  Does have hx/o iritis 2 x in the past.  No hx/o migraine or headache problems.  No other c/o.   ROS as in subjective   Objective:  Filed Vitals:   10/26/13 1339  BP: 132/80  Pulse: 76  Temp: 97.8 F (36.6 C)  Resp: 14    General appearance: alert, no distress, WD/WN Skin: Between eyes at base of nose with slightly swollen 1.5 cm area, pink central area, but no obvious erythema, fluctuance, warmth, induration, or drainage.   Eyes: PERRLA, EOMi, conjunctiav and eyelids normal, no obvious lesions HENT unremarkable Neck: supple, no lymphadenopathy, no rash or mass   Assessment: Encounter Diagnoses  Name Primary?  . Insect bite Yes  . Headache(784.0)      Plan: Discussed findings, no obvious necrosis or worrisome signs of brown recluse bite.   Gave recommendations, and call/return right away if worrisome changes as discussed.   Specific recommendations today include:  Use ice pack to the area 1-2 times daily  Begin Benadryl OTC 25mg .  Use 1-2 tablets at bedtime.  Use 1/2-1 tablet up to 3 times daily over the weekend  Use either Aleve OTC 2 tablets twice daily or Ibuprofen OTC, 3 tablets 3 times daily this weekend  Consider either OTC Cortaid/hydrocortisone OR triple antibiotic ointment  This should gradually improve over the weekend  If worse, then recheck

## 2013-10-26 NOTE — Patient Instructions (Addendum)
  Thank you for giving me the opportunity to serve you today.    Your diagnosis today includes: Encounter Diagnoses  Name Primary?  . Insect bite Yes  . Headache(784.0)     Specific recommendations today include:  Use ice pack to the area 1-2 times daily  Begin Benadryl OTC 25mg .  Use 1-2 tablets at bedtime.  Use 1/2-1 tablet up to 3 times daily over the weekend  Use either Aleve OTC 2 tablets twice daily or Ibuprofen OTC, 3 tablets 3 times daily this weekend  Consider either OTC Cortaid/hydrocortisone OR triple antibiotic ointment  This should gradually improve over the weekend  If worse, then recheck   Follow up as needed

## 2013-10-29 ENCOUNTER — Encounter: Payer: Self-pay | Admitting: Family Medicine

## 2013-10-29 ENCOUNTER — Ambulatory Visit (INDEPENDENT_AMBULATORY_CARE_PROVIDER_SITE_OTHER): Payer: Medicare Other | Admitting: Family Medicine

## 2013-10-29 VITALS — BP 110/72 | HR 72 | Temp 97.9°F | Ht 67.0 in | Wt 154.5 lb

## 2013-10-29 DIAGNOSIS — L03211 Cellulitis of face: Secondary | ICD-10-CM

## 2013-10-29 DIAGNOSIS — W57XXXA Bitten or stung by nonvenomous insect and other nonvenomous arthropods, initial encounter: Secondary | ICD-10-CM | POA: Diagnosis not present

## 2013-10-29 DIAGNOSIS — L0201 Cutaneous abscess of face: Secondary | ICD-10-CM | POA: Diagnosis not present

## 2013-10-29 DIAGNOSIS — L259 Unspecified contact dermatitis, unspecified cause: Secondary | ICD-10-CM | POA: Diagnosis not present

## 2013-10-29 DIAGNOSIS — L309 Dermatitis, unspecified: Secondary | ICD-10-CM

## 2013-10-29 DIAGNOSIS — T148 Other injury of unspecified body region: Secondary | ICD-10-CM | POA: Diagnosis not present

## 2013-10-29 DIAGNOSIS — H20019 Primary iridocyclitis, unspecified eye: Secondary | ICD-10-CM | POA: Diagnosis not present

## 2013-10-29 MED ORDER — DOXYCYCLINE HYCLATE 100 MG PO TABS: 100.0000 mg | ORAL_TABLET | Freq: Two times a day (BID) | ORAL | Status: DC

## 2013-10-29 NOTE — Patient Instructions (Signed)
  Stop the peroxide. Stop the neosporin. Keep clean with soap and water. You may continue to use hydrocortisone cream twice daily. Take the antibiotics to treat possible underlying infection contributing to the drainage and spread.  Avoid the sun while on this antibiotic.
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
## 2013-11-07 DIAGNOSIS — H20019 Primary iridocyclitis, unspecified eye: Secondary | ICD-10-CM | POA: Diagnosis not present

## 2013-12-12 ENCOUNTER — Encounter: Payer: Medicare Other | Admitting: Family Medicine

## 2013-12-29 LAB — HM MAMMOGRAPHY

## 2014-01-30 DIAGNOSIS — Z1231 Encounter for screening mammogram for malignant neoplasm of breast: Secondary | ICD-10-CM | POA: Diagnosis not present

## 2014-02-13 DIAGNOSIS — Z01419 Encounter for gynecological examination (general) (routine) without abnormal findings: Secondary | ICD-10-CM | POA: Diagnosis not present

## 2014-02-13 DIAGNOSIS — N959 Unspecified menopausal and perimenopausal disorder: Secondary | ICD-10-CM | POA: Diagnosis not present

## 2014-02-13 DIAGNOSIS — Z1212 Encounter for screening for malignant neoplasm of rectum: Secondary | ICD-10-CM | POA: Diagnosis not present

## 2014-02-13 LAB — HM DEXA SCAN: HM Dexa Scan: NORMAL

## 2014-02-28 ENCOUNTER — Other Ambulatory Visit (INDEPENDENT_AMBULATORY_CARE_PROVIDER_SITE_OTHER): Payer: Medicare Other

## 2014-02-28 DIAGNOSIS — Z23 Encounter for immunization: Secondary | ICD-10-CM

## 2014-04-01 ENCOUNTER — Encounter: Payer: Self-pay | Admitting: Family Medicine

## 2014-04-11 ENCOUNTER — Encounter: Payer: Self-pay | Admitting: Family Medicine

## 2014-04-11 ENCOUNTER — Ambulatory Visit (INDEPENDENT_AMBULATORY_CARE_PROVIDER_SITE_OTHER): Payer: Medicare Other | Admitting: Family Medicine

## 2014-04-11 VITALS — BP 124/80 | HR 68 | Temp 96.3°F | Ht 67.0 in | Wt 152.0 lb

## 2014-04-11 DIAGNOSIS — R21 Rash and other nonspecific skin eruption: Secondary | ICD-10-CM

## 2014-04-11 NOTE — Progress Notes (Signed)
Chief Complaint  Patient presents with  . Rash    first appeared with a HA x 5 days ago, still has low grade HA. Noticed the rash Monday-itchy rash on her arms and shoulders and back.    She started 5 days ago with headaches, and feeling achey.  She noticed a rash on her arms 3 days ago.  She first noticed it on the left arm, and when further investigated, noticed it on her right arm, the tops of her shoulders, and possibly on her neckline of her back because it is itchy there.  She feels itchy during the day, when the clothes touch her skin--less itchy with loose fitting clothing.  Denies any change in products--soaps, detergents, fabric softeners, foods.  She has some ongoing dull headache--at the top of the head.  Achiness has resolved.  She has been taking tylenol for the headaches, only helped partially with headaches at first, seemed to work better yesterday. She had some symptoms in her right eye (like something was in it), similar to when she has had iritis in the past, but it hasn't progressed at all.  PMH, PSH, SH reviewed.  Outpatient Encounter Prescriptions as of 04/11/2014  Medication Sig Note  . loratadine (CLARITIN) 10 MG tablet Take 10 mg by mouth daily. 04/11/2014: Takes as needed in the spring for allergies  . Multiple Vitamins-Minerals (CENTRUM ULTRA WOMENS PO) Take 1 tablet by mouth daily.     Marland Kitchen acetaminophen (TYLENOL) 325 MG tablet Take 650 mg by mouth every 6 (six) hours as needed.   . naproxen sodium (ANAPROX) 220 MG tablet Take 440 mg by mouth as needed. 04/11/2014: Uses prn knee--hasn't needed to use in a while  . valACYclovir (VALTREX) 500 MG tablet Take 500 mg by mouth as needed.   10/26/2013: PRN   . [DISCONTINUED] doxycycline (VIBRA-TABS) 100 MG tablet Take 1 tablet (100 mg total) by mouth 2 (two) times daily.     Allergies  Allergen Reactions  . Benadryl [Diphenhydramine]     Gives her palpitations  . Penicillins Hives    ROS: Denies fevers, chills, denies  URI symptoms, sore throat, cough, shortness of breath, wheezing, chest pain.  Denies nausea, vomiting, diarrhea, or urinary complaints. Denies athletes foot or other rashes.  Denies numbness, tingling, weakness  PHYSICAL EXAM: BP 124/80 mmHg  Pulse 68  Temp(Src) 96.3 F (35.7 C) (Tympanic)  Ht 5\' 7"  (1.702 m)  Wt 152 lb (68.947 kg)  BMI 23.80 kg/m2 Well developed, pleasant female in no distress HEENT:  PERRL, conjunctiva clear. OP is clear without erythema Neck: no lymphadenopathy, thyromegaly or mass Heart: regular rate and rhythm Lungs: clear bilaterally Skin: very small, scattered papules noted on arms (left anterior shoulder, and a few on anterior left forearm; on right side, noted medially/anterior on forearm, shoulder--just a few scattered).  Back of neck--skin is pretty smooth over area that itches, just 2-3 very small papules noted. The papules do not have umbilication, not vesicular. No erythema. Skin is well moisturized.  ASSESSMENT/PLAN:  Rash and nonspecific skin eruption - nondiagnostic.  Suspect viral etiology.  supportive treatment. f/u if symptoms worsen for re-eval   Nonspecific rash, suspect viral etiology given prodrome of headache and achiness. Recommend symptomatic treatment of the itching--antihistamines such as claritin or zyrtec, +/- benadryl if needed, along with hydrocortisone cream as needed for itching.  Return for re-evaluation if new symptoms develop

## 2014-04-11 NOTE — Patient Instructions (Signed)
  Nonspecific rash, suspect viral etiology given prodrome of headache and achiness. Recommend symptomatic treatment of the itching--antihistamines such as claritin or zyrtec, +/- benadryl if needed, along with hydrocortisone cream as needed for itching.  Return for re-evaluation if new symptoms develop

## 2014-04-16 DIAGNOSIS — H1010 Acute atopic conjunctivitis, unspecified eye: Secondary | ICD-10-CM | POA: Diagnosis not present

## 2014-04-16 DIAGNOSIS — H01009 Unspecified blepharitis unspecified eye, unspecified eyelid: Secondary | ICD-10-CM | POA: Diagnosis not present

## 2014-04-16 DIAGNOSIS — H04123 Dry eye syndrome of bilateral lacrimal glands: Secondary | ICD-10-CM | POA: Diagnosis not present

## 2014-08-22 ENCOUNTER — Ambulatory Visit
Admission: RE | Admit: 2014-08-22 | Discharge: 2014-08-22 | Disposition: A | Discharge status: Home / Self Care | Payer: Medicare Other | Source: Ambulatory Visit | Attending: Family Medicine | Admitting: Family Medicine

## 2014-08-22 ENCOUNTER — Encounter: Payer: Self-pay | Admitting: Family Medicine

## 2014-08-22 ENCOUNTER — Ambulatory Visit (INDEPENDENT_AMBULATORY_CARE_PROVIDER_SITE_OTHER): Payer: Medicare Other | Admitting: Family Medicine

## 2014-08-22 VITALS — BP 132/82 | HR 68 | Ht 67.5 in | Wt 154.8 lb

## 2014-08-22 DIAGNOSIS — R5383 Other fatigue: Secondary | ICD-10-CM | POA: Diagnosis not present

## 2014-08-22 DIAGNOSIS — R0789 Other chest pain: Secondary | ICD-10-CM | POA: Diagnosis not present

## 2014-08-22 DIAGNOSIS — E559 Vitamin D deficiency, unspecified: Secondary | ICD-10-CM | POA: Diagnosis not present

## 2014-08-22 DIAGNOSIS — E7800 Pure hypercholesterolemia, unspecified: Secondary | ICD-10-CM

## 2014-08-22 DIAGNOSIS — J309 Allergic rhinitis, unspecified: Secondary | ICD-10-CM

## 2014-08-22 DIAGNOSIS — Z23 Encounter for immunization: Secondary | ICD-10-CM | POA: Diagnosis not present

## 2014-08-22 DIAGNOSIS — Z7189 Other specified counseling: Secondary | ICD-10-CM | POA: Diagnosis not present

## 2014-08-22 DIAGNOSIS — Z0000 Encounter for general adult medical examination without abnormal findings: Secondary | ICD-10-CM | POA: Diagnosis not present

## 2014-08-22 DIAGNOSIS — I700 Atherosclerosis of aorta: Secondary | ICD-10-CM | POA: Diagnosis not present

## 2014-08-22 LAB — COMPREHENSIVE METABOLIC PANEL
ALBUMIN: 4.2 g/dL (ref 3.5–5.2)
ALT: 18 U/L (ref 0–35)
AST: 19 U/L (ref 0–37)
Alkaline Phosphatase: 85 U/L (ref 39–117)
BUN: 14 mg/dL (ref 6–23)
CO2: 27 mEq/L (ref 19–32)
Calcium: 9.2 mg/dL (ref 8.4–10.5)
Chloride: 100 mEq/L (ref 96–112)
Creat: 0.79 mg/dL (ref 0.50–1.10)
Glucose, Bld: 69 mg/dL — ABNORMAL LOW (ref 70–99)
Potassium: 4 mEq/L (ref 3.5–5.3)
Sodium: 136 mEq/L (ref 135–145)
TOTAL PROTEIN: 6.7 g/dL (ref 6.0–8.3)
Total Bilirubin: 0.7 mg/dL (ref 0.2–1.2)

## 2014-08-22 LAB — CBC WITH DIFFERENTIAL/PLATELET
BASOS PCT: 0 % (ref 0–1)
Basophils Absolute: 0 10*3/uL (ref 0.0–0.1)
EOS PCT: 1 % (ref 0–5)
Eosinophils Absolute: 0.1 10*3/uL (ref 0.0–0.7)
HEMATOCRIT: 41.2 % (ref 36.0–46.0)
Hemoglobin: 14 g/dL (ref 12.0–15.0)
Lymphocytes Relative: 37 % (ref 12–46)
Lymphs Abs: 2 10*3/uL (ref 0.7–4.0)
MCH: 31.6 pg (ref 26.0–34.0)
MCHC: 34 g/dL (ref 30.0–36.0)
MCV: 93 fL (ref 78.0–100.0)
MONO ABS: 0.3 10*3/uL (ref 0.1–1.0)
MPV: 8.5 fL — ABNORMAL LOW (ref 8.6–12.4)
Monocytes Relative: 5 % (ref 3–12)
NEUTROS ABS: 3.1 10*3/uL (ref 1.7–7.7)
Neutrophils Relative %: 57 % (ref 43–77)
Platelets: 302 10*3/uL (ref 150–400)
RBC: 4.43 MIL/uL (ref 3.87–5.11)
RDW: 13.2 % (ref 11.5–15.5)
WBC: 5.5 10*3/uL (ref 4.0–10.5)

## 2014-08-22 LAB — LIPID PANEL
CHOL/HDL RATIO: 4.4 ratio
CHOLESTEROL: 219 mg/dL — AB (ref 0–200)
HDL: 50 mg/dL (ref >=46)
LDL CALC: 149 mg/dL — AB (ref 0–99)
TRIGLYCERIDES: 99 mg/dL (ref <150)
VLDL: 20 mg/dL (ref 0–40)
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
## 2014-08-22 NOTE — Patient Instructions (Addendum)
  HEALTH MAINTENANCE RECOMMENDATIONS:  It is recommended that you get at least 30 minutes of aerobic exercise at least 5 days/week (for weight loss, you may need as much as 60-90 minutes). This can be any activity that gets your heart rate up. This can be divided in 10-15 minute intervals if needed, but try and build up your endurance at least once a week.  Weight bearing exercise is also recommended twice weekly.  Eat a healthy diet with lots of vegetables, fruits and fiber.  "Colorful" foods have a lot of vitamins (ie green vegetables, tomatoes, red peppers, etc).  Limit sweet tea, regular sodas and alcoholic beverages, all of which has a lot of calories and sugar.  Up to 1 alcoholic drink daily may be beneficial for women (unless trying to lose weight, watch sugars).  Drink a lot of water.  Calcium recommendations are 1200-1500 mg daily (1500 mg for postmenopausal women or women without ovaries), and vitamin D 1000 IU daily.  This should be obtained from diet and/or supplements (vitamins), and calcium should not be taken all at once, but in divided doses.  Monthly self breast exams and yearly mammograms for women over the age of 71 is recommended.  Sunscreen of at least SPF 30 should be used on all sun-exposed parts of the skin when outside between the hours of 10 am and 4 pm (not just when at beach or pool, but even with exercise, golf, tennis, and yard work!)  Use a sunscreen that says "broad spectrum" so it covers both UVA and UVB rays, and make sure to reapply every 1-2 hours.  Remember to change the batteries in your smoke detectors when changing your clock times in the spring and fall.  Use your seat belt every time you are in a car, and please drive safely and not be distracted with cell phones and texting while driving.  Please go to [facility] for chest x-ray. Please see if Dr. Carren Rang would send copies of your mammogram and bone density results so we can enter into your [facility] records.  Consider Mucinex instead of Robitussin if x-ray is normal. If ongoing chest pressure/tightness, we may need to evaluate further (ie pulmonary function tests, etc).

## 2014-08-23 ENCOUNTER — Encounter: Payer: Self-pay | Admitting: Family Medicine

## 2014-08-23 LAB — TSH: TSH: 3.018 u[IU]/mL (ref 0.350–4.500)

## 2014-08-23 LAB — VITAMIN D 25 HYDROXY (VIT D DEFICIENCY, FRACTURES): Vit D, 25-Hydroxy: 20 ng/mL — ABNORMAL LOW (ref 30–100)

## 2014-08-23 MED ORDER — VITAMIN D (ERGOCALCIFEROL) 1.25 MG (50000 UNIT) PO CAPS: 50000.0000 [IU] | ORAL_CAPSULE | ORAL | Status: DC

## 2014-08-23 NOTE — Addendum Note (Signed)
Addended by: Rita Ohara on: 08/23/2014 09:41 AM   Modules accepted: Orders

## 2014-08-29 DIAGNOSIS — H01009 Unspecified blepharitis unspecified eye, unspecified eyelid: Secondary | ICD-10-CM | POA: Diagnosis not present

## 2014-08-29 DIAGNOSIS — H2513 Age-related nuclear cataract, bilateral: Secondary | ICD-10-CM | POA: Diagnosis not present

## 2014-08-29 DIAGNOSIS — H35033 Hypertensive retinopathy, bilateral: Secondary | ICD-10-CM | POA: Diagnosis not present

## 2014-08-29 DIAGNOSIS — H40013 Open angle with borderline findings, low risk, bilateral: Secondary | ICD-10-CM | POA: Diagnosis not present

## 2014-11-09 ENCOUNTER — Other Ambulatory Visit: Payer: Self-pay | Admitting: Family Medicine

## 2014-11-25 ENCOUNTER — Ambulatory Visit (INDEPENDENT_AMBULATORY_CARE_PROVIDER_SITE_OTHER): Payer: Medicare Other | Admitting: Family Medicine

## 2014-11-25 ENCOUNTER — Encounter: Payer: Self-pay | Admitting: Family Medicine

## 2014-11-25 VITALS — BP 140/84 | HR 80 | Temp 97.8°F | Wt 156.4 lb

## 2014-11-25 DIAGNOSIS — R42 Dizziness and giddiness: Secondary | ICD-10-CM | POA: Diagnosis not present

## 2014-11-25 NOTE — Patient Instructions (Signed)
I suspect that you have a mild case of vertigo.  This may be related to a virus, or related to inner ear from your travels.  I do not see any evidence of an infection or of a stroke.  Use meclizine 12.5-25mg  three times daily if needed for this dizzy feeling.  This is available without a prescription (under the name Bonine and others, ask the pharmacist).  Return if persistent/worsening dizziness or any other new associated problems.  Go to ER if any weakness, severe headache with vomiting, or other stroke-like synptoms.  Vertigo Vertigo means you feel like you or your surroundings are moving when they are not. Vertigo can be dangerous if it occurs when you are at work, driving, or performing difficult activities.  CAUSES  Vertigo occurs when there is a conflict of signals sent to your brain from the visual and sensory systems in your body. There are many different causes of vertigo, including:  Infections, especially in the inner ear.  A bad reaction to a drug or misuse of alcohol and medicines.  Withdrawal from drugs or alcohol.  Rapidly changing positions, such as lying down or rolling over in bed.  A migraine headache.  Decreased blood flow to the brain.  Increased pressure in the brain from a head injury, infection, tumor, or bleeding. SYMPTOMS  You may feel as though the world is spinning around or you are falling to the ground. Because your balance is upset, vertigo can cause nausea and vomiting. You may have involuntary eye movements (nystagmus). DIAGNOSIS  Vertigo is usually diagnosed by physical exam. If the cause of your vertigo is unknown, your caregiver may perform imaging tests, such as an MRI scan (magnetic resonance imaging). TREATMENT  Most cases of vertigo resolve on their own, without treatment. Depending on the cause, your caregiver may prescribe certain medicines. If your vertigo is related to body position issues, your caregiver may recommend movements or procedures  to correct the problem. In rare cases, if your vertigo is caused by certain inner ear problems, you may need surgery. HOME CARE INSTRUCTIONS   Follow your caregiver's instructions.  Avoid driving.  Avoid operating heavy machinery.  Avoid performing any tasks that would be dangerous to you or others during a vertigo episode.  Tell your caregiver if you notice that certain medicines seem to be causing your vertigo. Some of the medicines used to treat vertigo episodes can actually make them worse in some people. SEEK IMMEDIATE MEDICAL CARE IF:   Your medicines do not relieve your vertigo or are making it worse.  You develop problems with talking, walking, weakness, or using your arms, hands, or legs.  You develop severe headaches.  Your nausea or vomiting continues or gets worse.  You develop visual changes.  A family member notices behavioral changes.  Your condition gets worse. MAKE SURE YOU:  Understand these instructions.  Will watch your condition.  Will get help right away if you are not doing well or get worse. Document Released: 02/24/2005 Document Revised: 08/09/2011 Document Reviewed: 12/03/2010 [facility] Patient Information 2015 [place], [place]. This information is not intended to replace advice given to you by your health care provider. Make sure you discuss any questions you have with your health care provider.

## 2014-11-25 NOTE — Progress Notes (Signed)
Chief Complaint  Patient presents with  . possible vertigo    possible vertigo. aching at back of neck. starting having issues saturday. having dizziness, nausea, whoozing head feeling- buzzing in ear   Two days ago, later in the day, she started feeling dizzy, described as feeling like being on a cruise ship.  Yesterday she got up to go to the bathroom, and she felt like she was staggering to the bathroom, and the dizziness was much worse.  Looking at her knobs in the bathroom, they looked like they were moving.  Throughout the day, she continued to have ongoing dizziness, along with a headache.  Headache was posterior, and into her neck, across the whole back of the neck, like her head was too heavy for her neck.  She had some associated nausea.  She took Aleve, which helped reduce the headache.  This morning she felt better, just very dull ache in her "general head" (whole head feels "dull"), no longer having the neck pain or posterior headache. She still has mild sensation of equilibrium being off, only slight nausea.  Overall, the dizziness today is much better than it was yesterday. Dizziness is not positional--occurs with any position, worse when she moves her head quickly, such as shaking her head "no" to answer my questions.  She reports that she had a similar sensation one day while she was on vacation last week she felt a little dizzy, but was better by the next morning. She had traveled to [place]--including plane flights and train ride x2 days from [place] to [place].  She returned from trip 6/22. On 6/23 she started with scratchy throat and feeling like a cold was coming, but cold symptoms never truly developed.  Her ears had felt a little more sensitive on the flight back (sensitive to the pressure changes).  Denies fevers, chills.  Tinnitus is chronic and unchanged (normally fluctuates some).  Denies ear pain or hearing loss.  Denies any URI or allergy symptoms.  PMH, PSH, SH reviewed.  Outpatient  Encounter Prescriptions as of 11/25/2014  Medication Sig Note  . Multiple Vitamins-Minerals (CENTRUM ULTRA WOMENS PO) Take 1 tablet by mouth daily.     . naproxen sodium (ANAPROX) 220 MG tablet Take 440 mg by mouth as needed. 04/11/2014: Uses prn knee--hasn't needed to use in a while  . acetaminophen (TYLENOL) 325 MG tablet Take 650 mg by mouth every 6 (six) hours as needed.   . loratadine (CLARITIN) 10 MG tablet Take 10 mg by mouth daily. 04/11/2014: Takes as needed in the spring for allergies  . valACYclovir (VALTREX) 500 MG tablet Take 500 mg by mouth as needed.   10/26/2013: PRN   . Vitamin D, Ergocalciferol, (DRISDOL) 50000 UNITS CAPS capsule Take 1 capsule (50,000 Units total) by mouth every 7 (seven) days. (Patient not taking: Reported on 11/25/2014)    No facility-administered encounter medications on file as of 11/25/2014.   Allergies  Allergen Reactions  . Benadryl [Diphenhydramine]     Gives her palpitations  . Penicillins Hives   ROS:   No fever, chills, lightheadedness, syncope. No numbness/tingling/weakness, memory or speech difficulty (just usual carpal tunnel symptoms in her right arm, related to sleep position sometimes).  No chest pain, palpitations.  Has ongoing issue with intermittent barking cough, unchanged She had CXR in March to eval for complaint of cough and heaviness--normal (except atherosclerosis). No shortness of breath.  PHYSICAL EXAM: BP 140/84 mmHg  Pulse 80  Temp(Src) 97.8 F (36.6 C) (Tympanic)  Wt 156 lb 6.4 oz (70.943 kg)  Well developed, pleasant, well appearing female in no distress HEENT: PERRL, EOMI, conjunctiva clear.  TM's and EAC's normal--slight area of erythema/abrasion on left ear canal. TM's completely normal. Fundi benign OP is clear. Nasal mucosa appears normal. Sinuses are nontender c-spine is nontender, head is nontender Heart: regular rate.  Rhythm sounds like bigeminy, very regular. No murmur, rub, gallop Lungs: clear  bilaterally Neuro: alert and oriented. Cranial nerves intact. Normal strength, sensation, no pronator drift. Normal finger to nose. Normal gait. No nystagmus. DTR's 2+ and symmetric.  ASSESSMENT/PLAN:  Vertigo  Ddx reviewed extensively.  Suggest possibly viral.  No evidence of infection, tumor or stroke. Return if persistent or worsening symptoms new neuro symptoms develop. Trial of OTC meclizine.  Risks/side effects reviewed.

## 2014-11-28 DIAGNOSIS — H40013 Open angle with borderline findings, low risk, bilateral: Secondary | ICD-10-CM | POA: Diagnosis not present

## 2014-12-25 ENCOUNTER — Encounter: Payer: Self-pay | Admitting: *Deleted

## 2015-02-17 DIAGNOSIS — Z1212 Encounter for screening for malignant neoplasm of rectum: Secondary | ICD-10-CM | POA: Diagnosis not present

## 2015-02-17 DIAGNOSIS — Z124 Encounter for screening for malignant neoplasm of cervix: Secondary | ICD-10-CM | POA: Diagnosis not present

## 2015-02-17 DIAGNOSIS — Z6824 Body mass index (BMI) 24.0-24.9, adult: Secondary | ICD-10-CM | POA: Diagnosis not present

## 2015-03-07 DIAGNOSIS — Z1231 Encounter for screening mammogram for malignant neoplasm of breast: Secondary | ICD-10-CM | POA: Diagnosis not present

## 2015-03-07 LAB — HM MAMMOGRAPHY: HM Mammogram: NORMAL

## 2015-03-10 ENCOUNTER — Other Ambulatory Visit (INDEPENDENT_AMBULATORY_CARE_PROVIDER_SITE_OTHER): Payer: Medicare Other

## 2015-03-10 DIAGNOSIS — Z23 Encounter for immunization: Secondary | ICD-10-CM | POA: Diagnosis not present

## 2015-03-11 ENCOUNTER — Encounter: Payer: Self-pay | Admitting: *Deleted

## 2015-04-02 ENCOUNTER — Encounter: Payer: Self-pay | Admitting: Family Medicine

## 2015-04-02 ENCOUNTER — Ambulatory Visit (INDEPENDENT_AMBULATORY_CARE_PROVIDER_SITE_OTHER): Payer: Medicare Other | Admitting: Family Medicine

## 2015-04-02 VITALS — BP 140/86 | HR 72 | Ht 67.5 in | Wt 152.8 lb

## 2015-04-02 DIAGNOSIS — E7800 Pure hypercholesterolemia, unspecified: Secondary | ICD-10-CM | POA: Diagnosis not present

## 2015-04-02 DIAGNOSIS — A6000 Herpesviral infection of urogenital system, unspecified: Secondary | ICD-10-CM

## 2015-04-02 DIAGNOSIS — I700 Atherosclerosis of aorta: Secondary | ICD-10-CM

## 2015-04-02 DIAGNOSIS — E559 Vitamin D deficiency, unspecified: Secondary | ICD-10-CM

## 2015-04-02 MED ORDER — VALACYCLOVIR HCL 500 MG PO TABS
ORAL_TABLET | ORAL | Status: DC
Start: 2015-04-02 — End: 2015-12-03

## 2015-04-02 NOTE — Patient Instructions (Signed)
Use the Valtrex twice daily for 3 days, at earliest onset of outbreak.  You can use it once daily for prevention (ie when very stressed).  We will let you know if you need additional vitamin D precription.  Regardless of results, you should add at least 1000 IU of vitamin D separately, in addition to your multivitamin, every day.  If your results are extremely low, you might need another prescription, or I might suggest a higher dose (ie 2000).  Consider starting an enteric coated 81mg  of aspirin daily.  This might help prevent stroke or heart disease.  If you have side effects (bruising, bleeding, upper stomach pain), then stop the aspirin.   Atherosclerosis Atherosclerosis, or hardening of the arteries, is the buildup of plaque within the major arteries in the body. Plaque is made up of fats (lipids), cholesterol, calcium, and fibrous tissue. Plaque can narrow or block blood flow within an artery. Plaque can break off and cause damage to the affected organ. Plaque can also "rupture." When plaque ruptures within an artery, a clot can form, causing a sudden (acute) blockage of the artery. Untreated atherosclerosis can cause serious health problems or death.  RISK FACTORS  High cholesterol levels.  Smoking.  Obesity.  Lack of activity or exercise.  Eating a diet high in saturated fat.  Family history.  Diabetes. SIGNS AND SYMPTOMS  Symptoms of atherosclerosis can occur when blood flow to an artery is slowed or blocked. Severity and onset of symptoms depends on how extensive the narrowing or blockage is. A sudden plaque rupture can bring immediate, life-threatening symptoms. Atherosclerosis can affect different arteries in the body, for example:  Coronary arteries. The coronary arteries supply the heart with blood. When the coronary arteries are narrowed or blocked from atherosclerosis, this is known as coronary artery disease (CAD). CAD can cause a heart attack. Common heart attack  symptoms include:  Chest pain or pain that radiates to the neck, arm, jaw, or in the upper, middle back (mid-scapular pain).  Shortness of breath without cause.  Profuse sweating while at rest.  Irregular heartbeats.  Nausea or gastrointestinal upset.  Carotid arteries. The carotid arteries supply the brain with blood. They are located on each side of your neck. When blood flow to these arteries is slowed or blocked, a transient ischemic attack (TIA) or stroke can occur. A TIA is considered a "mini-stroke" or "warning stroke." TIA symptoms are the same as stroke symptoms, but they are temporary and last less than 24 hours. A stroke can cause permanent damage or death. Common TIA and stroke symptoms include:  Sudden numbness or weakness to one side of your body, such as the face, arm, or leg.  Sudden confusion or trouble speaking or understanding.  Sudden trouble seeing out of one or both eyes.  Sudden trouble walking, loss of balance, or dizziness.  Sudden, severe headache with no known cause.  Arteries in the legs. When arteries in the lower legs become narrowed or blocked, this is known as peripheral vascular disease (PVD). PVD can cause a symptom called claudication. Claudication is pain or a burning feeling in your legs when walking or exercising and usually goes away with rest. Very severe PVD can cause pain in your legs while at rest.  Renal arteries. The renal arteries supply the kidneys with blood. Blockage of the renal arteries can cause a decline in kidney function or high blood pressure (hypertension).  Gastrointestinal arteries (mesenteric circulation). Abdominal pain may occur after eating. DIAGNOSIS  Your health care provider may perform the following tests to diagnose atherosclerosis:  Blood tests.  Stress test.  Echocardiogram.  Nuclear scan.  Ankle/brachial index.  Ultrasonography.  Computed tomography (CT) scan.  Angiography. TREATMENT    Atherosclerosis treatment includes the following:  Lifestyle changes such as:  Quitting smoking. Your health care provider can help you with smoking cessation.  Eating a diet low in saturated fat. A registered dietitian can educate you on healthy food options, such as helping you understand the difference between good fat and bad fat.  Following an exercise program approved by your health care provider.  Maintaining a healthy weight. Lose weight as approved by your health care provider.  Have your cholesterol levels checked as directed by your health care provider.  Medicines. Cholesterol medicines can help slow or stop the progression of atherosclerosis.  Different procedural or surgical interventions to treat atherosclerosis include:  Balloon angioplasty. The technical name for balloon angioplasty is percutaneous transluminal angioplasty (PTA). In this procedure, a catheter with a small balloon at the tip is inserted through the blocked or narrowed artery. The balloon is then inflated. When the balloon is inflated, the fatty plaque is compressed against the artery wall, allowing better blood flow within the artery.  Balloon angioplasty and stenting. In this procedure, balloon angioplasty is combined with a stenting procedure. A stent is a small, metal mesh tube that keeps the artery open. After the artery is opened up by the balloon technique, the stent is then deployed. The stent is permanent.  Open heart surgery or bypass surgery. To perform this type of surgery, a healthy vessel is first "harvested" from either the leg or arm. The harvested vessel is then used to "bypass" the blocked atherosclerotic vessel so new blood flow can be established.  Atherectomy. Atherectomy is a procedure that uses a catheter with a sharp blade to remove plaque from an artery. A chamber in the catheter collects the plaque.  Endarterectomy. An endarterectomy is a surgical procedure where a surgeon removes  plaque from an artery.  Amputation. When blockages in the lower legs are very severe and circulation cannot be restored, amputation may be required. SEEK IMMEDIATE MEDICAL CARE IF:  You are having heart attack symptoms, such as:  Chest pain or pain that radiates to the neck, arm, jaw, or in the upper, middle back (mid-scapular pain).  Shortness of breath without cause.  Profuse sweating while at rest.  Irregular heartbeats.  Nausea or gastrointestinal upset.  You are having stroke symptoms, such as sudden:  Numbness or weakness to one side of your body, such as the face, arm, or leg.  Confusion or trouble speaking or understanding.  Trouble seeing out of one or both eyes.  Trouble walking, loss of balance, or dizziness.  Severe headache with no known cause.  Your hands or feet are bluish, cold, or you have pain in them.  You have bad abdominal pain after eating. Symptoms of heart attack or stroke may represent a serious problem that is an emergency. Do not wait to see if the symptoms will go away. Get medical help right away. Call your local emergency services (911 in the [place] Do not drive yourself to the hospital.   This information is not intended to replace advice given to you by your health care provider. Make sure you discuss any questions you have with your health care provider.   Document Released: 08/07/2003 Document Revised: 06/07/2014 Document Reviewed: 07/20/2011 Elsevier Interactive Patient Education [employer].

## 2015-04-02 NOTE — Progress Notes (Signed)
Chief Complaint  Patient presents with  . Advice Only    need refill on her valtrex(generic) for outbreaks, usually Dr Carren Rang would do #30 with 11 refills-he did retire and she thought he called in for her, when she called back he was gone. Would like to transfer her gyn care to here if possible and have done with her annual exam. Not fasting today so will come back for labs another day.   Marland Kitchen Results    would like to review mammo results also.    Genital herpes.  She saw Dr. Carren Rang in September for her annual exam, and he then retired.  [facility] apparently wouldn't refill the prescription for him (as told to her by pharmacy, she didn't call them directly). She gets breakouts related to stress. Used the Valtrex recently when had stress related to move (no actual breakout, used preventatively). She reports using the medication just once daily prn.  Vitamin D deficiency:  Level was 20 in March.  She took 12 weeks of prescription vitamin D, got sun exposure over the summer, but didn't increase/change her supplements.  Continues to take the same MVI every day (as she was taking prior to being diagnosed with low levels). She was due for recheck in September. Previously had some constipation from supplements, but was from a Calcium +D, not D alone.  Hyperlipidemia:  She cut back on bread and butter, cut back on cheese. Still eating one boiled egg most days, including the yolk--now only about 5x/week instead of 7, eating oatmeal on some of the other days instead.  She was supposed to return in September for repeat lipids, didn't, and isn't fasting today.  Wondering about "breast composition category B"--saw this in her letter with her mammogram results.  She also has questions about the finding of atherosclerosis on her CXR she had in March. FINDINGS: Lung volumes are normal. No consolidative airspace disease. No pleural effusions. No pneumothorax. No pulmonary nodule or mass noted. Pulmonary  vasculature and the cardiomediastinal silhouette are within normal limits. Atherosclerosis in the thoracic aorta.  IMPRESSION: 1. No radiographic evidence of acute cardiopulmonary disease. 2. Atherosclerosis.   PMH, [place], SH reviewed  Outpatient Encounter Prescriptions as of 04/02/2015  Medication Sig Note  . Multiple Vitamins-Minerals (CENTRUM ULTRA WOMENS PO) Take 1 tablet by mouth daily.     . naproxen sodium (ANAPROX) 220 MG tablet Take 440 mg by mouth as needed. 04/02/2015: Uses prn--for knee pain; currently using for carpal tunnel  . acetaminophen (TYLENOL) 325 MG tablet Take 650 mg by mouth every 6 (six) hours as needed.   . loratadine (CLARITIN) 10 MG tablet Take 10 mg by mouth daily. 04/11/2014: Takes as needed in the spring for allergies  . valACYclovir (VALTREX) 500 MG tablet Take 500 mg by mouth as needed.   10/26/2013: PRN   . [DISCONTINUED] Vitamin D, Ergocalciferol, (DRISDOL) 50000 UNITS CAPS capsule Take 1 capsule (50,000 Units total) by mouth every 7 (seven) days. (Patient not taking: Reported on 11/25/2014)    No facility-administered encounter medications on file as of 04/02/2015.   Allergies  Allergen Reactions  . Benadryl [Diphenhydramine]     Gives her palpitations  . Penicillins Hives    ROS: no fever, chills, headaches, URI symptoms, bleeding, bruising, rash, chest pain, claudication, palpitations, shortness of breath, neuro complaints, GI complaints or other concerns, except as noted in HPI.  PHYSICAL EXAM: BP 140/86 mmHg  Pulse 72  Ht 5' 7.5" (1.715 m)  Wt 152 lb  12.8 oz (69.31 kg)  BMI 23.57 kg/m2   Well appearing pleasant female in no distress Neuro: alert and oriented, normal cranial nerves, strength, gait Psych: normal mood, affect, hygiene and grooming Remainder of visit was limited to counseling and answering questions.   ASSESSMENT/PLAN:  Genital herpes - reviewed proper way to use Valtrex prn outbreaks (had been taking just 1 daily) - Plan:  valACYclovir (VALTREX) 500 MG tablet  Vitamin D deficiency - recheck; discussed need for additional long-term supplementation, in addition to her MVI  Pure hypercholesterolemia - reviewed low cholesterol diet in detail. Due for recheck--return fasting  Thoracic aorta atherosclerosis ([place]) - discussed diagnosis, need to control BP, cholesterol; consider ASA 81mg  daily   Return fasting for labs. Will contact her with results and recommendations (re: vitamin D, lipids)  Explained breast density; mammo was normal. All questions answered.   Use the Valtrex twice daily for 3 days, at earliest onset of outbreak.  You can use it once daily for prevention (ie when very stressed).  We will let you know if you need additional vitamin D precription.  Regardless of results, you should add at least 1000 IU of vitamin D separately, in addition to your multivitamin, every day.  If your results are extremely low, you might need another prescription, or I might suggest a higher dose (ie 2000).  Consider starting an enteric coated 81mg  of aspirin daily.  This might help prevent stroke or heart disease.  If you have side effects (bruising, bleeding, upper stomach pain), then stop the aspirin.  25-30 min visit, more than 1/2 spent counseling

## 2015-04-07 ENCOUNTER — Other Ambulatory Visit: Payer: Medicare Other

## 2015-04-07 DIAGNOSIS — E7800 Pure hypercholesterolemia, unspecified: Secondary | ICD-10-CM | POA: Diagnosis not present

## 2015-04-07 DIAGNOSIS — E559 Vitamin D deficiency, unspecified: Secondary | ICD-10-CM

## 2015-04-07 LAB — LIPID PANEL
CHOL/HDL RATIO: 3.4 ratio (ref <=5.0)
Cholesterol: 182 mg/dL (ref 125–200)
HDL: 54 mg/dL (ref >=46)
LDL CALC: 109 mg/dL (ref <130)
TRIGLYCERIDES: 95 mg/dL (ref <150)
VLDL: 19 mg/dL (ref <30)

## 2015-04-08 ENCOUNTER — Other Ambulatory Visit: Payer: Self-pay | Admitting: Family Medicine

## 2015-04-08 DIAGNOSIS — E559 Vitamin D deficiency, unspecified: Secondary | ICD-10-CM

## 2015-04-08 LAB — VITAMIN D 25 HYDROXY (VIT D DEFICIENCY, FRACTURES): Vit D, 25-Hydroxy: 22 ng/mL — ABNORMAL LOW (ref 30–100)

## 2015-04-08 MED ORDER — VITAMIN D (ERGOCALCIFEROL) 1.25 MG (50000 UNIT) PO CAPS: 50000.0000 [IU] | ORAL_CAPSULE | ORAL | Status: DC

## 2015-08-25 ENCOUNTER — Ambulatory Visit (INDEPENDENT_AMBULATORY_CARE_PROVIDER_SITE_OTHER): Payer: Medicare Other | Admitting: Family Medicine

## 2015-08-25 ENCOUNTER — Encounter: Payer: Self-pay | Admitting: Family Medicine

## 2015-08-25 VITALS — BP 160/90 | HR 60 | Ht 66.5 in | Wt 155.6 lb

## 2015-08-25 DIAGNOSIS — L659 Nonscarring hair loss, unspecified: Secondary | ICD-10-CM | POA: Diagnosis not present

## 2015-08-25 DIAGNOSIS — R030 Elevated blood-pressure reading, without diagnosis of hypertension: Secondary | ICD-10-CM

## 2015-08-25 DIAGNOSIS — Z0000 Encounter for general adult medical examination without abnormal findings: Secondary | ICD-10-CM

## 2015-08-25 DIAGNOSIS — IMO0001 Reserved for inherently not codable concepts without codable children: Secondary | ICD-10-CM

## 2015-08-25 DIAGNOSIS — E7800 Pure hypercholesterolemia, unspecified: Secondary | ICD-10-CM | POA: Diagnosis not present

## 2015-08-25 DIAGNOSIS — E559 Vitamin D deficiency, unspecified: Secondary | ICD-10-CM

## 2015-08-25 DIAGNOSIS — R5383 Other fatigue: Secondary | ICD-10-CM | POA: Diagnosis not present

## 2015-08-25 DIAGNOSIS — Z1159 Encounter for screening for other viral diseases: Secondary | ICD-10-CM

## 2015-08-25 LAB — CBC WITH DIFFERENTIAL/PLATELET
BASOS PCT: 0 % (ref 0–1)
Basophils Absolute: 0 10*3/uL (ref 0.0–0.1)
Eosinophils Absolute: 0.1 10*3/uL (ref 0.0–0.7)
Eosinophils Relative: 1 % (ref 0–5)
HEMATOCRIT: 41.8 % (ref 36.0–46.0)
HEMOGLOBIN: 14.5 g/dL (ref 12.0–15.0)
LYMPHS PCT: 40 % (ref 12–46)
Lymphs Abs: 2.3 10*3/uL (ref 0.7–4.0)
MCH: 32.6 pg (ref 26.0–34.0)
MCHC: 34.7 g/dL (ref 30.0–36.0)
MCV: 93.9 fL (ref 78.0–100.0)
MONO ABS: 0.3 10*3/uL (ref 0.1–1.0)
MONOS PCT: 6 % (ref 3–12)
MPV: 8.5 fL — ABNORMAL LOW (ref 8.6–12.4)
NEUTROS ABS: 3 10*3/uL (ref 1.7–7.7)
Neutrophils Relative %: 53 % (ref 43–77)
Platelets: 294 10*3/uL (ref 150–400)
RBC: 4.45 MIL/uL (ref 3.87–5.11)
RDW: 13.2 % (ref 11.5–15.5)
WBC: 5.7 10*3/uL (ref 4.0–10.5)

## 2015-08-25 LAB — COMPREHENSIVE METABOLIC PANEL
ALT: 18 U/L (ref 6–29)
AST: 20 U/L (ref 10–35)
Albumin: 4.1 g/dL (ref 3.6–5.1)
Alkaline Phosphatase: 82 U/L (ref 33–130)
BUN: 15 mg/dL (ref 7–25)
CHLORIDE: 104 mmol/L (ref 98–110)
CO2: 26 mmol/L (ref 20–31)
Calcium: 9.1 mg/dL (ref 8.6–10.4)
Creat: 0.77 mg/dL (ref 0.50–0.99)
GLUCOSE: 80 mg/dL (ref 65–99)
POTASSIUM: 4.3 mmol/L (ref 3.5–5.3)
Sodium: 137 mmol/L (ref 135–146)
Total Bilirubin: 0.6 mg/dL (ref 0.2–1.2)
Total Protein: 6.7 g/dL (ref 6.1–8.1)

## 2015-08-25 LAB — TSH: TSH: 3 mIU/L

## 2015-08-25 LAB — LIPID PANEL
CHOL/HDL RATIO: 3.1 ratio (ref <=5.0)
Cholesterol: 173 mg/dL (ref 125–200)
HDL: 55 mg/dL (ref >=46)
LDL Cholesterol: 98 mg/dL (ref <130)
TRIGLYCERIDES: 98 mg/dL (ref <150)
VLDL: 20 mg/dL (ref <30)

## 2015-08-25 LAB — HEPATITIS C ANTIBODY: HCV Ab: NEGATIVE
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
## 2015-08-25 NOTE — Patient Instructions (Signed)
HEALTH MAINTENANCE RECOMMENDATIONS:  It is recommended that you get at least 30 minutes of aerobic exercise at least 5 days/week (for weight loss, you may need as much as 60-90 minutes). This can be any activity that gets your heart rate up. This can be divided in 10-15 minute intervals if needed, but try and build up your endurance at least once a week.  Weight bearing exercise is also recommended twice weekly.  Eat a healthy diet with lots of vegetables, fruits and fiber.  "Colorful" foods have a lot of vitamins (ie green vegetables, tomatoes, red peppers, etc).  Limit sweet tea, regular sodas and alcoholic beverages, all of which has a lot of calories and sugar.  Up to 1 alcoholic drink daily may be beneficial for women (unless trying to lose weight, watch sugars).  Drink a lot of water.  Calcium recommendations are 1200-1500 mg daily (1500 mg for postmenopausal women or women without ovaries), and vitamin D 1000 IU daily.  This should be obtained from diet and/or supplements (vitamins), and calcium should not be taken all at once, but in divided doses.  Monthly self breast exams and yearly mammograms for women over the age of 48 is recommended.  Sunscreen of at least SPF 30 should be used on all sun-exposed parts of the skin when outside between the hours of 10 am and 4 pm (not just when at beach or pool, but even with exercise, golf, tennis, and yard work!)  Use a sunscreen that says "broad spectrum" so it covers both UVA and UVB rays, and make sure to reapply every 1-2 hours.  Remember to change the batteries in your smoke detectors when changing your clock times in the spring and fall.  Use your seat belt every time you are in a car, and please drive safely and not be distracted with cell phones and texting while driving.    Annette Monroe , Thank you for taking time to come for your Medicare Wellness Visit. I appreciate your ongoing commitment to your health goals. Please review the  following plan we discussed and let me know if I can assist you in the future.   These are the goals we discussed: Goals    None      This is a list of the screening recommended for you and due dates:  Health Maintenance  Topic Date Due  .  Hepatitis C: One time screening is recommended by Center for Disease Control  (CDC) for  adults born from [birth year] through 1963.   1946/05/14  . Flu Shot  12/30/2015  . Mammogram  03/06/2017  . Colon Cancer Screening  10/16/2017  . Tetanus Vaccine  02/07/2021  . DEXA scan (bone density measurement)  Completed  . Shingles Vaccine  Completed  . Pneumonia vaccines  Completed   We are doing the hepatitis C screen today. Next mammogram is due 02/2016 (not 2018 as stated above; I recommend yearly mammogram) Colon cancer screening-date above is 10 years from your last colonoscopy. We will be proceeding with Cologard as screening for colon cancer instead (every 3 years if negative; if positive, you will need a colonoscopy).  Your blood pressure was slightly elevated today. Periodically check it elsewhere, and if remains >140/90, please return to discuss. Try and limit the sodium/salt in your diet, avoiding foods that contain a lot of salt, and not adding extra salt (limit processed foods, salted nuts, crackers, chips, pickles, olives, canned foods, etc).  Please get 1000 IU of Vitamin D3 (  over-the-counter) and begin taking this daily, long-term, in addition to your multivitamin.

## 2015-08-26 LAB — VITAMIN D 25 HYDROXY (VIT D DEFICIENCY, FRACTURES): VIT D 25 HYDROXY: 29 ng/mL — AB (ref 30–100)

## 2015-09-08 DIAGNOSIS — Z1212 Encounter for screening for malignant neoplasm of rectum: Secondary | ICD-10-CM | POA: Diagnosis not present

## 2015-09-08 DIAGNOSIS — Z1211 Encounter for screening for malignant neoplasm of colon: Secondary | ICD-10-CM | POA: Diagnosis not present

## 2015-09-08 LAB — COLOGUARD: COLOGUARD: NEGATIVE

## 2015-09-17 DIAGNOSIS — H25013 Cortical age-related cataract, bilateral: Secondary | ICD-10-CM | POA: Diagnosis not present

## 2015-09-17 DIAGNOSIS — H35033 Hypertensive retinopathy, bilateral: Secondary | ICD-10-CM | POA: Diagnosis not present

## 2015-09-17 DIAGNOSIS — H2513 Age-related nuclear cataract, bilateral: Secondary | ICD-10-CM | POA: Diagnosis not present

## 2015-09-17 DIAGNOSIS — H40013 Open angle with borderline findings, low risk, bilateral: Secondary | ICD-10-CM | POA: Diagnosis not present

## 2015-12-02 ENCOUNTER — Other Ambulatory Visit: Payer: Self-pay | Admitting: Family Medicine

## 2015-12-02 DIAGNOSIS — A6000 Herpesviral infection of urogenital system, unspecified: Secondary | ICD-10-CM

## 2015-12-03 MED ORDER — VALACYCLOVIR HCL 500 MG PO TABS: ORAL_TABLET | ORAL | Status: DC

## 2015-12-03 NOTE — Addendum Note (Signed)
Addended by: Carolee Rota F on: 12/03/2015 08:36 AM   Modules accepted: Orders

## 2016-03-03 ENCOUNTER — Other Ambulatory Visit (INDEPENDENT_AMBULATORY_CARE_PROVIDER_SITE_OTHER): Payer: Medicare Other

## 2016-03-03 DIAGNOSIS — Z23 Encounter for immunization: Secondary | ICD-10-CM

## 2016-03-10 DIAGNOSIS — H40013 Open angle with borderline findings, low risk, bilateral: Secondary | ICD-10-CM | POA: Diagnosis not present

## 2016-03-10 DIAGNOSIS — H04123 Dry eye syndrome of bilateral lacrimal glands: Secondary | ICD-10-CM | POA: Diagnosis not present

## 2016-03-23 DIAGNOSIS — Z1231 Encounter for screening mammogram for malignant neoplasm of breast: Secondary | ICD-10-CM | POA: Diagnosis not present

## 2016-06-29 ENCOUNTER — Encounter: Payer: Self-pay | Admitting: Family Medicine

## 2016-06-29 ENCOUNTER — Ambulatory Visit (INDEPENDENT_AMBULATORY_CARE_PROVIDER_SITE_OTHER): Payer: Medicare Other | Admitting: Family Medicine

## 2016-06-29 VITALS — BP 120/70 | HR 69 | Wt 160.0 lb

## 2016-06-29 DIAGNOSIS — S80811A Abrasion, right lower leg, initial encounter: Secondary | ICD-10-CM

## 2016-06-29 NOTE — Progress Notes (Signed)
   Subjective:    Patient ID: Annette Monroe, female    DOB: 01-03-1946, 71 y.o.   MRN: [id number]  HPI She injured her right anterior lower shin 2 weeks ago when she tripped over some furniture. She is concerned about pain that she had over the weekend. She did show pictures that showed no evidence of erythema.   Review of Systems     Objective:   Physical Exam Exam of the right shin does show a healing abrasion with pink edges, no erythema warmth or tenderness.       Assessment & Plan:  Abrasion of anterior lower leg, right, initial encounter  I reassured her that the wound is healing appropriately and will take a long time due to the area. Recommend conservative Care for it.

## 2016-08-02 DIAGNOSIS — Z6825 Body mass index (BMI) 25.0-25.9, adult: Secondary | ICD-10-CM | POA: Diagnosis not present

## 2016-08-02 DIAGNOSIS — Z01419 Encounter for gynecological examination (general) (routine) without abnormal findings: Secondary | ICD-10-CM | POA: Diagnosis not present
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
## 2016-08-30 ENCOUNTER — Ambulatory Visit (INDEPENDENT_AMBULATORY_CARE_PROVIDER_SITE_OTHER): Payer: Medicare Other | Admitting: Family Medicine

## 2016-08-30 ENCOUNTER — Encounter: Payer: Self-pay | Admitting: Family Medicine

## 2016-08-30 VITALS — BP 140/86 | HR 68 | Ht 67.0 in | Wt 159.4 lb

## 2016-08-30 DIAGNOSIS — I700 Atherosclerosis of aorta: Secondary | ICD-10-CM

## 2016-08-30 DIAGNOSIS — Z0000 Encounter for general adult medical examination without abnormal findings: Secondary | ICD-10-CM | POA: Diagnosis not present

## 2016-08-30 DIAGNOSIS — E7800 Pure hypercholesterolemia, unspecified: Secondary | ICD-10-CM | POA: Diagnosis not present

## 2016-08-30 DIAGNOSIS — E559 Vitamin D deficiency, unspecified: Secondary | ICD-10-CM

## 2016-08-30 DIAGNOSIS — R635 Abnormal weight gain: Secondary | ICD-10-CM

## 2016-08-30 DIAGNOSIS — R232 Flushing: Secondary | ICD-10-CM

## 2016-08-30 DIAGNOSIS — R030 Elevated blood-pressure reading, without diagnosis of hypertension: Secondary | ICD-10-CM | POA: Diagnosis not present

## 2016-08-30 DIAGNOSIS — A6000 Herpesviral infection of urogenital system, unspecified: Secondary | ICD-10-CM | POA: Diagnosis not present

## 2016-08-30 LAB — COMPREHENSIVE METABOLIC PANEL
ALBUMIN: 4.1 g/dL (ref 3.6–5.1)
ALT: 14 U/L (ref 6–29)
AST: 17 U/L (ref 10–35)
Alkaline Phosphatase: 95 U/L (ref 33–130)
BUN: 14 mg/dL (ref 7–25)
CALCIUM: 9.4 mg/dL (ref 8.6–10.4)
CHLORIDE: 104 mmol/L (ref 98–110)
CO2: 26 mmol/L (ref 20–31)
Creat: 0.8 mg/dL (ref 0.50–0.99)
Glucose, Bld: 98 mg/dL (ref 65–99)
Potassium: 4.1 mmol/L (ref 3.5–5.3)
Sodium: 139 mmol/L (ref 135–146)
TOTAL PROTEIN: 6.8 g/dL (ref 6.1–8.1)
Total Bilirubin: 0.5 mg/dL (ref 0.2–1.2)

## 2016-08-30 LAB — CBC WITH DIFFERENTIAL/PLATELET
BASOS ABS: 0 {cells}/uL (ref 0–200)
Basophils Relative: 0 %
EOS ABS: 59 {cells}/uL (ref 15–500)
Eosinophils Relative: 1 %
HCT: 40.4 % (ref 35.0–45.0)
Hemoglobin: 13.6 g/dL (ref 11.7–15.5)
Lymphocytes Relative: 42 %
Lymphs Abs: 2478 cells/uL (ref 850–3900)
MCH: 31.4 pg (ref 27.0–33.0)
MCHC: 33.7 g/dL (ref 32.0–36.0)
MCV: 93.3 fL (ref 80.0–100.0)
MONOS PCT: 6 %
MPV: 9 fL (ref 7.5–12.5)
Monocytes Absolute: 354 cells/uL (ref 200–950)
NEUTROS ABS: 3009 {cells}/uL (ref 1500–7800)
Neutrophils Relative %: 51 %
PLATELETS: 311 10*3/uL (ref 140–400)
RBC: 4.33 MIL/uL (ref 3.80–5.10)
RDW: 13.2 % (ref 11.0–15.0)
WBC: 5.9 10*3/uL (ref 4.0–10.5)

## 2016-08-30 LAB — POCT URINALYSIS DIPSTICK
BILIRUBIN UA: NEGATIVE
Glucose, UA: NEGATIVE
KETONES UA: NEGATIVE
Nitrite, UA: NEGATIVE
PH UA: 6 (ref 5.0–8.0)
Protein, UA: NEGATIVE
RBC UA: NEGATIVE
SPEC GRAV UA: 1.015 (ref 1.030–1.035)
Urobilinogen, UA: NEGATIVE (ref ?–2.0)

## 2016-08-30 LAB — LIPID PANEL
CHOL/HDL RATIO: 3.6 ratio (ref <5.0)
CHOLESTEROL: 200 mg/dL — AB (ref <200)
HDL: 55 mg/dL (ref >50)
LDL Cholesterol: 126 mg/dL — ABNORMAL HIGH (ref <100)
TRIGLYCERIDES: 94 mg/dL (ref <150)
VLDL: 19 mg/dL (ref <30)

## 2016-08-30 NOTE — Patient Instructions (Addendum)
HEALTH MAINTENANCE RECOMMENDATIONS:  It is recommended that you get at least 30 minutes of aerobic exercise at least 5 days/week (for weight loss, you may need as much as 60-90 minutes). This can be any activity that gets your heart rate up. This can be divided in 10-15 minute intervals if needed, but try and build up your endurance at least once a week.  Weight bearing exercise is also recommended twice weekly.  Eat a healthy diet with lots of vegetables, fruits and fiber.  "Colorful" foods have a lot of vitamins (ie green vegetables, tomatoes, red peppers, etc).  Limit sweet tea, regular sodas and alcoholic beverages, all of which has a lot of calories and sugar.  Up to 1 alcoholic drink daily may be beneficial for women (unless trying to lose weight, watch sugars).  Drink a lot of water.  Calcium recommendations are 1200-1500 mg daily (1500 mg for postmenopausal women or women without ovaries), and vitamin D 1000 IU daily.  This should be obtained from diet and/or supplements (vitamins), and calcium should not be taken all at once, but in divided doses.  Monthly self breast exams and yearly mammograms for women over the age of 53 is recommended.  Sunscreen of at least SPF 30 should be used on all sun-exposed parts of the skin when outside between the hours of 10 am and 4 pm (not just when at beach or pool, but even with exercise, golf, tennis, and yard work!)  Use a sunscreen that says "broad spectrum" so it covers both UVA and UVB rays, and make sure to reapply every 1-2 hours.  Remember to change the batteries in your smoke detectors when changing your clock times in the spring and fall.  Use your seat belt every time you are in a car, and please drive safely and not be distracted with cell phones and texting while driving.   [name] , Thank you for taking time to come for your Medicare Wellness Visit. I appreciate your ongoing commitment to your health goals. Please review the  following plan we discussed and let me know if I can assist you in the future.   These are the goals we discussed: Goals    None      This is a list of the screening recommended for you and due dates:  Health Maintenance  Topic Date Due  . Flu Shot  12/29/2016  . Mammogram  03/06/2017  . Colon Cancer Screening  10/16/2017  . Tetanus Vaccine  02/07/2021  . DEXA scan (bone density measurement)  Completed  .  Hepatitis C: One time screening is recommended by Center for Disease Control  (CDC) for  adults born from [birth year] through 1963.   Completed  . Pneumonia vaccines  Completed   Next colon cancer screening will be due in 2020 (because you had Cologard in 2017). Bone density--please have Dr. Corinna Monroe check Dr. Roque Monroe records to see about your bone density.  I have no results from any bone density tests.  If it was normal after the age of 6, another one isn't needed.  If it showed some thinning of the bones, then a follow up study may be indicated.  I recommend getting the new shingles vaccine (Shingrix) when available. You will need to check with your insurance to see if it is covered, and if covered by Medicare Part D, you need to get from the pharmacy rather than our office.  It is a series of 2 injections, spaced 2 months  apart.  Check your blood pressure at varying times at least 2-3 times/week and record as shown.  Bring the list and your monitor to your next visit.  Follow a low sodium diet.  Try and exercise 30 minutes daily. If BP's remains >135-140/85-90, medication will be needed. Goal is <120-130//70-80.   DASH Eating Plan DASH stands for "Dietary Approaches to Stop Hypertension." The DASH eating plan is a healthy eating plan that has been shown to reduce high blood pressure (hypertension). It may also reduce your risk for type 2 diabetes, heart disease, and stroke. The DASH eating plan may also help with weight loss. What are tips for following this plan? General guidelines    Avoid eating more than 2,300 mg (milligrams) of salt (sodium) a day. If you have hypertension, you may need to reduce your sodium intake to 1,500 mg a day.  Limit alcohol intake to no more than 1 drink a day for nonpregnant women and 2 drinks a day for men. One drink equals 12 oz of beer, 5 oz of wine, or 1 oz of hard liquor.  Work with your health care provider to maintain a healthy body weight or to lose weight. Ask what an ideal weight is for you.  Get at least 30 minutes of exercise that causes your heart to beat faster (aerobic exercise) most days of the week. Activities may include walking, swimming, or biking.  Work with your health care provider or diet and nutrition specialist (dietitian) to adjust your eating plan to your individual calorie needs. Reading food labels   Check food labels for the amount of sodium per serving. Choose foods with less than 5 percent of the Daily Value of sodium. Generally, foods with less than 300 mg of sodium per serving fit into this eating plan.  To find whole grains, look for the word "whole" as the first word in the ingredient list. Shopping   Buy products labeled as "low-sodium" or "no salt added."  Buy fresh foods. Avoid canned foods and premade or frozen meals. Cooking   Avoid adding salt when cooking. Use salt-free seasonings or herbs instead of table salt or sea salt. Check with your health care provider or pharmacist before using salt substitutes.  Do not fry foods. Cook foods using healthy methods such as baking, boiling, grilling, and broiling instead.  Cook with heart-healthy oils, such as olive, canola, soybean, or sunflower oil. Meal planning    Eat a balanced diet that includes:  5 or more servings of fruits and vegetables each day. At each meal, try to fill half of your plate with fruits and vegetables.  Up to 6-8 servings of whole grains each day.  Less than 6 oz of lean meat, poultry, or fish each day. A 3-oz serving  of meat is about the same size as a deck of cards. One egg equals 1 oz.  2 servings of low-fat dairy each day.  A serving of nuts, seeds, or beans 5 times each week.  Heart-healthy fats. Healthy fats called Omega-3 fatty acids are found in foods such as flaxseeds and coldwater fish, like sardines, salmon, and mackerel.  Limit how much you eat of the following:  Canned or prepackaged foods.  Food that is high in trans fat, such as fried foods.  Food that is high in saturated fat, such as fatty meat.  Sweets, desserts, sugary drinks, and other foods with added sugar.  Full-fat dairy products.  Do not salt foods before eating.  Try  to eat at least 2 vegetarian meals each week.  Eat more home-cooked food and less restaurant, buffet, and fast food.  When eating at a restaurant, ask that your food be prepared with less salt or no salt, if possible. What foods are recommended? The items listed may not be a complete list. Talk with your dietitian about what dietary choices are best for you. Grains  Whole-grain or whole-wheat bread. Whole-grain or whole-wheat pasta. Brown rice. [place]. Bulgur. Whole-grain and low-sodium cereals. Pita bread. Low-fat, low-sodium crackers. Whole-wheat flour tortillas. Vegetables  Fresh or frozen vegetables (raw, steamed, roasted, or grilled). Low-sodium or reduced-sodium tomato and vegetable juice. Low-sodium or reduced-sodium tomato sauce and tomato paste. Low-sodium or reduced-sodium canned vegetables. Fruits  All fresh, dried, or frozen fruit. Canned fruit in natural juice (without added sugar). Meat and other protein foods  Skinless chicken or [place]. Ground chicken or [place]. Pork with fat trimmed off. Fish and seafood. Egg whites. Dried beans, peas, or lentils. Unsalted nuts, nut butters, and seeds. Unsalted canned beans. Lean cuts of beef with fat trimmed off. Low-sodium, lean deli meat. Dairy  Low-fat (1%) or fat-free (skim) milk. Fat-free,  low-fat, or reduced-fat cheeses. Nonfat, low-sodium ricotta or cottage cheese. Low-fat or nonfat yogurt. Low-fat, low-sodium cheese. Fats and oils  Soft margarine without trans fats. Vegetable oil. Low-fat, reduced-fat, or light mayonnaise and salad dressings (reduced-sodium). Canola, safflower, olive, soybean, and sunflower oils. Avocado. Seasoning and other foods  Herbs. Spices. Seasoning mixes without salt. Unsalted popcorn and pretzels. Fat-free sweets. What foods are not recommended? The items listed may not be a complete list. Talk with your dietitian about what dietary choices are best for you. Grains  Baked goods made with fat, such as croissants, muffins, or some breads. Dry pasta or rice meal packs. Vegetables  Creamed or fried vegetables. Vegetables in a cheese sauce. Regular canned vegetables (not low-sodium or reduced-sodium). Regular canned tomato sauce and paste (not low-sodium or reduced-sodium). Regular tomato and vegetable juice (not low-sodium or reduced-sodium). Angie Fava. Olives. Fruits  Canned fruit in a light or heavy syrup. Fried fruit. Fruit in cream or butter sauce. Meat and other protein foods  Fatty cuts of meat. Ribs. Fried meat. Berniece Salines. Sausage. Bologna and other processed lunch meats. Salami. Fatback. Hotdogs. Bratwurst. Salted nuts and seeds. Canned beans with added salt. Canned or smoked fish. Whole eggs or egg yolks. Chicken or [place] with skin. Dairy  Whole or 2% milk, cream, and half-and-half. Whole or full-fat cream cheese. Whole-fat or sweetened yogurt. Full-fat cheese. Nondairy creamers. Whipped toppings. Processed cheese and cheese spreads. Fats and oils  Butter. Stick margarine. Lard. Shortening. Ghee. Bacon fat. Tropical oils, such as coconut, palm kernel, or palm oil. Seasoning and other foods  Salted popcorn and pretzels. Onion salt, garlic salt, seasoned salt, table salt, and sea salt. Worcestershire sauce. Tartar sauce. Barbecue sauce. Teriyaki sauce.  Soy sauce, including reduced-sodium. Steak sauce. Canned and packaged gravies. Fish sauce. Oyster sauce. Cocktail sauce. Horseradish that you find on the shelf. Ketchup. Mustard. Meat flavorings and tenderizers. Bouillon cubes. Hot sauce and Tabasco sauce. Premade or packaged marinades. Premade or packaged taco seasonings. Relishes. Regular salad dressings. Where to find more information:  National Heart, Lung, and [place]: https://wilson-eaton.com/  American Heart Association: www.heart.org Summary  The DASH eating plan is a healthy eating plan that has been shown to reduce high blood pressure (hypertension). It may also reduce your risk for type 2 diabetes, heart disease, and stroke.  With the DASH eating  plan, you should limit salt (sodium) intake to 2,300 mg a day. If you have hypertension, you may need to reduce your sodium intake to 1,500 mg a day.  When on the DASH eating plan, aim to eat more fresh fruits and vegetables, whole grains, lean proteins, low-fat dairy, and heart-healthy fats.  Work with your health care provider or diet and nutrition specialist (dietitian) to adjust your eating plan to your individual calorie needs. This information is not intended to replace advice given to you by your health care provider. Make sure you discuss any questions you have with your health care provider. Document Released: 05/06/2011 Document Revised: 05/10/2016 Document Reviewed: 05/10/2016 Elsevier Interactive Patient Education  2017 [employer].

## 2016-08-31 LAB — VITAMIN D 25 HYDROXY (VIT D DEFICIENCY, FRACTURES): VIT D 25 HYDROXY: 25 ng/mL — AB (ref 30–100)

## 2016-08-31 LAB — TSH: TSH: 3.44 m[IU]/L

## 2016-08-31 MED ORDER — VITAMIN D (ERGOCALCIFEROL) 1.25 MG (50000 UNIT) PO CAPS: ORAL_CAPSULE | ORAL | 1 refills | Status: DC

## 2016-08-31 NOTE — Addendum Note (Signed)
Addended by: Rita Ohara on: 08/31/2016 08:15 AM   Modules accepted: Orders

## 2016-10-31 NOTE — Progress Notes (Signed)
Chief Complaint  Patient presents with  . Follow-up    hypertension follow up.     Patient presents to follow up on blood pressure.  Blood pressure was elevated at her physical in April, and had been running high on her home monitor as well (she had a couple of high readings at doctor visits, so had just gotten a monitor).  We did not start medications at that time, but discussed low sodium diet, regular exercise, and regular monitoring.  She presents today with her list of BP's from home.  Morning blood pressures range from 126-133/68-75.  Afternoon blood pressures (before dinner) 129-146/68-82.  Noted once 140/75 immediately after taking her trashcans out.  Injury to her right shin in January--she used Mederma, which helped make it a lot flatter.  It is still very dark.  She has been using OTC bleaching cream, but is asking for prescription strength.  Her cholesterol was found to be a little higher than on previous checks, still acceptable.  Advised to cut back on cholesterol in diet.  Lab Results  Component Value Date   CHOL 200 (H) 08/30/2016   HDL 55 08/30/2016   LDLCALC 126 (H) 08/30/2016   TRIG 94 08/30/2016   CHOLHDL 3.6 08/30/2016    Vitamin D deficiency: She was found to have vitamin D level of 25 with last labs in April. She was given a prescription for 12 weeks of weekly Vitamin D (50,000 IU), which she is taking; she is to switch to taking this prescription once monthly after the 12 weeks (she was given a refill).  PMH, PSH, SH reviewed  Outpatient Encounter Prescriptions as of 11/01/2016  Medication Sig Note  . Multiple Vitamins-Minerals (CENTRUM ULTRA WOMENS PO) Take 1 tablet by mouth daily.     . valACYclovir (VALTREX) 500 MG tablet Take 500 mg by mouth daily.   . Vitamin D, Ergocalciferol, (DRISDOL) 50000 units CAPS capsule Take once capsule once weekly for 12 weeks, then as directed by MD   . acetaminophen (TYLENOL) 325 MG tablet Take 650 mg by mouth every 6 (six) hours  as needed. Reported on 08/25/2015   . hydroquinone 4 % cream Apply topically 2 (two) times daily.   Marland Kitchen loratadine (CLARITIN) 10 MG tablet Take 10 mg by mouth daily. Reported on 08/25/2015 04/11/2014: Takes as needed in the spring for allergies  . naproxen sodium (ANAPROX) 220 MG tablet Take 440 mg by mouth as needed.   . [DISCONTINUED] valACYclovir (VALTREX) 500 MG tablet Take 1 tablet by mouth twice daily for 3 days, as needed for herpes outbreak (Patient not taking: Reported on 08/30/2016)    No facility-administered encounter medications on file as of 11/01/2016.    Allergies  Allergen Reactions  . Benadryl [Diphenhydramine]     Gives her palpitations  . Penicillins Hives    ROS: no fever, chills, URI symptoms, chest pain, shortness of breath, cough, edema, GI or GU complaints, bleeding, bruising, rash.  Persistent hyperpigmentation after injury to shin   PHYSICAL EXAM  BP (!) 144/80 (BP Location: Left Arm, Patient Position: Sitting, Cuff Size: Normal)   Pulse 68   Ht 5\' 7"  (1.702 m)   Wt 159 lb 12.8 oz (72.5 kg)   BMI 25.03 kg/m   Pt's machine 144/82, nurse check 144/80 138/76 on repeat by MD, later  Pleasant female in no distress HEENT: hair loss noted on stop of head (thinning), unchanged.  PERRL, EOMI, conjunctiva and sclera are clear Neck: no lymphadenopathy, thyromegaly or mass  Heart: regular rate and rhythm Lungs: clear bilaterally Extremities: normal pulses, no edema Skin: Left shin--hyperpigmentation at mid shin on RLE.  nontender   ASSESSMENT/PLAN:   Elevated blood pressure reading - accurate monitor, BP's are lower at home. Continue monitor, low sodium diet. f/u if consistently high  Vitamin D deficiency - reviewed supplementation recommendations (50,000 IU once a month after completing initial 12 weeks)  Hyperpigmentation of skin, postinflammatory - Plan: hydroquinone 4 % cream

## 2016-11-01 ENCOUNTER — Ambulatory Visit (INDEPENDENT_AMBULATORY_CARE_PROVIDER_SITE_OTHER): Payer: Medicare Other | Admitting: Family Medicine

## 2016-11-01 ENCOUNTER — Encounter: Payer: Self-pay | Admitting: Family Medicine

## 2016-11-01 VITALS — BP 138/76 | HR 68 | Ht 67.0 in | Wt 159.8 lb

## 2016-11-01 DIAGNOSIS — E559 Vitamin D deficiency, unspecified: Secondary | ICD-10-CM | POA: Diagnosis not present

## 2016-11-01 DIAGNOSIS — R030 Elevated blood-pressure reading, without diagnosis of hypertension: Secondary | ICD-10-CM

## 2016-11-01 DIAGNOSIS — L810 Postinflammatory hyperpigmentation: Secondary | ICD-10-CM | POA: Diagnosis not present

## 2016-11-01 MED ORDER — HYDROQUINONE 4 % EX CREA: TOPICAL_CREAM | Freq: Two times a day (BID) | CUTANEOUS | 1 refills | Status: DC

## 2016-11-01 NOTE — Patient Instructions (Signed)
Use the bleaching cream twice daily for up to 3 months (stop if not effective at all after 2-3 months).  Continue low sodium diet, regular exercise and monitoring your blood pressure.  Your blood pressure machine is accurate.  Goal is <130/80, but mid 130's is acceptable.  If consistently >140, please return sooner than April.

## 2016-11-02 ENCOUNTER — Encounter: Payer: Self-pay | Admitting: Family Medicine

## 2016-11-11 DIAGNOSIS — H25013 Cortical age-related cataract, bilateral: Secondary | ICD-10-CM | POA: Diagnosis not present

## 2016-11-11 DIAGNOSIS — H2513 Age-related nuclear cataract, bilateral: Secondary | ICD-10-CM | POA: Diagnosis not present

## 2016-11-11 DIAGNOSIS — H40013 Open angle with borderline findings, low risk, bilateral: Secondary | ICD-10-CM | POA: Diagnosis not present

## 2016-11-11 DIAGNOSIS — H35033 Hypertensive retinopathy, bilateral: Secondary | ICD-10-CM | POA: Diagnosis not present

## 2016-11-20 ENCOUNTER — Encounter (HOSPITAL_COMMUNITY): Payer: Self-pay | Admitting: *Deleted

## 2016-11-20 ENCOUNTER — Ambulatory Visit (HOSPITAL_COMMUNITY)
Admission: EM | Admit: 2016-11-20 | Discharge: 2016-11-20 | Disposition: A | Discharge status: Home / Self Care | Payer: Medicare Other | Attending: Family Medicine | Admitting: Family Medicine

## 2016-11-20 DIAGNOSIS — G43A0 Cyclical vomiting, not intractable: Secondary | ICD-10-CM

## 2016-11-20 DIAGNOSIS — R42 Dizziness and giddiness: Secondary | ICD-10-CM | POA: Diagnosis not present

## 2016-11-20 MED ORDER — ONDANSETRON 4 MG PO TBDP
ORAL_TABLET | ORAL | Status: AC
  Filled 2016-11-20: qty 1

## 2016-11-20 MED ORDER — MECLIZINE HCL 25 MG PO TABS: 25.0000 mg | ORAL_TABLET | Freq: Three times a day (TID) | ORAL | 0 refills | Status: DC | PRN

## 2016-11-20 MED ORDER — ONDANSETRON 4 MG PO TBDP
4.0000 mg | ORAL_TABLET | Freq: Once | ORAL | Status: AC
  Administered 2016-11-20: 4 mg via ORAL

## 2016-11-20 MED ORDER — ONDANSETRON 4 MG PO TBDP: 4.0000 mg | ORAL_TABLET | Freq: Three times a day (TID) | ORAL | 0 refills | Status: DC | PRN

## 2016-11-20 NOTE — ED Provider Notes (Signed)
CSN: [id number]     Arrival date & time 11/20/16  1206 History   None    Chief Complaint  Patient presents with  . Dizziness   (Consider location/radiation/quality/duration/timing/severity/associated sxs/prior Treatment) Patient c/o dizziness and headache and nausea for 2 days.   The history is provided by the patient.  Dizziness  Quality:  Head spinning Severity:  Moderate Onset quality:  Sudden Duration:  2 days Timing:  Constant Progression:  Worsening Chronicity:  New Relieved by:  Nothing Worsened by:  Nothing Ineffective treatments:  None tried Associated symptoms: nausea     Past Medical History:  Diagnosis Date  . Abscessed tooth   . Alopecia   . Carpal tunnel syndrome of right wrist   . Genital herpes   . Heart murmur   . Iritis 2004, 2007   Dr. Ricki Miller, and [facility]; negative work-up  . Pneumonia age 60   pt was hospitalized at age 39  . Unspecified vitamin D deficiency 2012   treated by Dr. Carren Rang initially, then here   Past Surgical History:  Procedure Laterality Date  . ABDOMINAL HYSTERECTOMY  2000   partial-ovaries remaining  . APPENDECTOMY  1981  . BREAST SURGERY  2004   breast reduction  . DIAGNOSTIC LAPAROSCOPY  1999   Family History  Problem Relation Age of Onset  . Hypertension Mother   . Diabetes Mother   . Stroke Mother   . Dementia Mother   . Cancer Mother        leukemia (chronic)  . Depression Mother   . Diabetes Father   . Hypertension Father   . Parkinsonism Father   . Hypertension Sister   . COPD Sister   . Heart disease Sister 90       (smoker)  . Hypertension Brother   . Stroke Brother   . Hypertension Brother   . Hyperlipidemia Brother   . Hyperlipidemia Brother   . Hypertension Brother    Social History  Substance Use Topics  . Smoking status: Never Smoker  . Smokeless tobacco: Never Used  . Alcohol use No     Comment: rarely, maybe once a year.   OB History    Gravida Para Term Preterm AB Living    1       1     SAB TAB Ectopic Multiple Live Births   1             Review of Systems  Constitutional: Negative.   HENT: Negative.   Eyes: Negative.   Respiratory: Negative.   Cardiovascular: Negative.   Gastrointestinal: Positive for nausea.  Endocrine: Negative.   Genitourinary: Negative.   Musculoskeletal: Negative.   Allergic/Immunologic: Negative.   Neurological: Positive for dizziness.  Hematological: Negative.   Psychiatric/Behavioral: Negative.     Allergies  Benadryl [diphenhydramine] and Penicillins  Home Medications   Prior to Admission medications   Medication Sig Start Date End Date Taking? Authorizing Provider  acetaminophen (TYLENOL) 325 MG tablet Take 650 mg by mouth every 6 (six) hours as needed. Reported on 08/25/2015    [provider]  hydroquinone 4 % cream Apply topically 2 (two) times daily. 11/01/16   Rita Ohara, MD  loratadine (CLARITIN) 10 MG tablet Take 10 mg by mouth daily. Reported on 08/25/2015    [provider]  meclizine (ANTIVERT) 25 MG tablet Take 1 tablet (25 mg total) by mouth 3 (three) times daily as needed for dizziness. 11/20/16   Lysbeth Penner, FNP  Multiple  Vitamins-Minerals (CENTRUM ULTRA WOMENS PO) Take 1 tablet by mouth daily.      [provider]  naproxen sodium (ANAPROX) 220 MG tablet Take 440 mg by mouth as needed.    [provider]  ondansetron (ZOFRAN ODT) 4 MG disintegrating tablet Take 1 tablet (4 mg total) by mouth every 8 (eight) hours as needed for nausea or vomiting. 11/20/16   Lysbeth Penner, FNP  valACYclovir (VALTREX) 500 MG tablet Take 500 mg by mouth daily.    [provider]  Vitamin D, Ergocalciferol, (DRISDOL) 50000 units CAPS capsule Take once capsule once weekly for 12 weeks, then as directed by MD 08/31/16   Rita Ohara, MD   Meds Ordered and Administered this Visit   Medications  ondansetron (ZOFRAN-ODT) disintegrating tablet 4 mg (4 mg Oral Given 11/20/16 1257)     BP (!) 167/84 Comment: third BP check  Pulse 72   Temp 98.6 F (37 C) (Oral)   Resp 16   SpO2 100%  No data found.   Physical Exam  Constitutional: She is oriented to person, place, and time. She appears well-developed and well-nourished.  HENT:  Head: Normocephalic and atraumatic.  Right Ear: External ear normal.  Left Ear: External ear normal.  Mouth/Throat: Oropharynx is clear and moist.  Eyes: Conjunctivae and EOM are normal. Pupils are equal, round, and reactive to light.  EOMI and dizziness and nystagmus present  Neck: Normal range of motion. Neck supple.  Cardiovascular: Normal rate, regular rhythm and normal heart sounds.   Pulmonary/Chest: Effort normal and breath sounds normal.  Neurological: She is alert and oriented to person, place, and time.  Nursing note and vitals reviewed.   Urgent Care Course     Procedures (including critical care time)  Labs Review Labs Reviewed - No data to display  Imaging Review No results found.   Visual Acuity Review  Right Eye Distance:   Left Eye Distance:   Bilateral Distance:    Right Eye Near:   Left Eye Near:    Bilateral Near:         MDM   1. Vertigo   2. Cyclical vomiting with nausea, intractability of vomiting not specified    Zofran ODT 4mg  one po tid prn #21 Meclizine 25mg  one po tid prn #21  Zofran ODT 4mg  one po now  Push po fluids, rest, tylenol and motrin otc prn as directed for fever, arthralgias, and myalgias.  Follow up prn if sx's continue or persist.    Lysbeth Penner, FNP 11/20/16 [place], [place] 12/21/16 1330

## 2016-11-20 NOTE — ED Triage Notes (Signed)
Dizzy   Vomiting     Headache     Since yest         Had    Dizzy  Head       Good  Health       Awake  And  Alert        Feels   Weak  As   Well        No  Diarrhea

## 2016-12-14 ENCOUNTER — Encounter: Payer: Self-pay | Admitting: Family Medicine

## 2016-12-15 ENCOUNTER — Encounter: Payer: Self-pay | Admitting: Family Medicine

## 2016-12-15 ENCOUNTER — Ambulatory Visit (INDEPENDENT_AMBULATORY_CARE_PROVIDER_SITE_OTHER): Payer: Medicare Other | Admitting: Family Medicine

## 2016-12-15 VITALS — BP 120/70 | HR 80 | Ht 67.0 in | Wt 161.4 lb

## 2016-12-15 DIAGNOSIS — H9313 Tinnitus, bilateral: Secondary | ICD-10-CM

## 2016-12-15 DIAGNOSIS — R42 Dizziness and giddiness: Secondary | ICD-10-CM

## 2016-12-15 NOTE — Progress Notes (Signed)
Chief Complaint  Patient presents with  . Dizziness    had some vertigo about 3 weeks ago, started again Monday. Wonders if she has wax in her ears? Also mentions that she has some arthritis in her neck and cannot get a comfortable position on her pillow.    Went to UC a few weeks ago, 6/23. She reports that she felt dizzy in a store, got worse when she was home. She checked BP and it was fine.  Developed nausea when she ate.  Had some room spinning that night, nausea was worse. Vomited once, with ongoing nausea, gagging.  Friend took her to UC where she was given prescription for zofran and meclizine. Symptoms lasted x 5 days. She was doing well until earlier this week.  2 days ago she woke up, put her feet on the floor and the room was spinning again. Didn't feel it laying down.  Had trouble even reading her cell phone to cancel an appointment.  Tried to eat--the smell made the nausea worse. She took zofran which helped with the nausea. She also took the meclizine, but the vertigo persisted, laid around all day.  Yesterday she started feeling a little better, continued to take meclizine.  Today she feels good.  She hasn't taken meclizine today.  She feels like her head is a little "not clear", "wooly", but no dizziness.  Had symptoms when upright, but also had some laying down, not really positional. Yesterday noticed some symptoms when she leaned over to pick something up. Today feels good--no vertigo or lightheadedness.  Both times she had discomfort in the back of her neck--not a headache, but more of a discomfort like she slept wrong. She has known arthritis in her neck.  She often has some discomfort and stiffness in her neck in the mornings.  She has chronic tinnitus, a little worse in the right ear for the last few days.  Tinnitus is constant, x years, some days worse than others.  She saw ENT years ago (over 8 years ago), when the tinnitus first started.  She has seen an audiologist, has some  hearing loss (high frequency, hearing aid not recommended)  She had tried to make her own appointment for an ENT, just to make sure nothing else was wrong.  She reports that Dr. Benjamine Mola had no availability until October. Then spoke to [facility], who said a referral was needed due to the diagnosis of vertigo.  PMH, PSH, SH reviewed  Outpatient Encounter Prescriptions as of 12/15/2016  Medication Sig Note  . hydroquinone 4 % cream Apply topically 2 (two) times daily.   . Multiple Vitamins-Minerals (CENTRUM ULTRA WOMENS PO) Take 1 tablet by mouth daily.     . Vitamin D, Ergocalciferol, (DRISDOL) 50000 units CAPS capsule Take once capsule once weekly for 12 weeks, then as directed by MD 12/15/2016: Weekly rx completed-now taking once a month  . acetaminophen (TYLENOL) 325 MG tablet Take 650 mg by mouth every 6 (six) hours as needed. Reported on 08/25/2015   . loratadine (CLARITIN) 10 MG tablet Take 10 mg by mouth daily. Reported on 08/25/2015 04/11/2014: Takes as needed in the spring for allergies  . meclizine (ANTIVERT) 25 MG tablet Take 1 tablet (25 mg total) by mouth 3 (three) times daily as needed for dizziness. (Patient not taking: Reported on 12/15/2016)   . naproxen sodium (ANAPROX) 220 MG tablet Take 440 mg by mouth as needed.   . ondansetron (ZOFRAN ODT) 4 MG disintegrating tablet Take  1 tablet (4 mg total) by mouth every 8 (eight) hours as needed for nausea or vomiting. (Patient not taking: Reported on 12/15/2016)   . valACYclovir (VALTREX) 500 MG tablet Take 500 mg by mouth daily.    No facility-administered encounter medications on file as of 12/15/2016.    Allergies  Allergen Reactions  . Benadryl [Diphenhydramine]     Gives her palpitations  . Penicillins Hives   ROS: no fever, chills, URI symptoms, ear pain, cough, runny nose.  Dizziness resolved.  No sore throat.  No chest pain, palpitations, nausea (resolved), bowel changes, abdominal pain, bleeding, bruising, rash.  +constant  tinnitus, no significant change in hearing loss.  No numbness, tingling, or other neurologic symptoms  PHYSICAL EXAM:  BP 120/70 (BP Location: Right Arm, Patient Position: Standing, Cuff Size: Normal)   Pulse 80   Ht 5\' 7"  (1.702 m)   Wt 161 lb 6.4 oz (73.2 kg)   BMI 25.28 kg/m   Not orthostatic (wide fluctuations in BP, not technically orthostatic, no change in pulse) Well appearing, pleasant female in no distress HEENT: conjunctiva and sclera are clear, EOMI, fundi benign. TM's and EAC's normal. Nasal mucosa--normal, slight clear mucus on right. OP is clear. Sinuses are nontender Neck: no lymphadenopathy, thyromegaly or carotid bruit Heart: regular rate and rhythm Lungs: clear bilatearlly Neuro: cranial nerves 2-12 intact.  Normal strength, gait, DTR's. EOMI. Normal finger to nose, no pronator drift. Normal strength, sensation, gait. No nystagmus.  No reproducibility of vertigo with position changes Extremities: no edema Psych: normal mood, affect, hygiene and grooming  ASSESSMENT/PLAN:   Vertigo - Ddx discussed. resolved.  no positional component. Cont meclizine and zofran prn. Refer to ENT per request - Plan: Ambulatory referral to ENT  Tinnitus of both ears     Continue the meclizine as needed for vertigo (found in various over-the-counter forms, including "less drowsy formulation of Dramamine", bonine, etc.  25mg  of meclizine. If used early in treating the vertigo, this may help decrease the nausea. If you have persistent nausea, it is fine to use the ondansetron along with it. Go to the ER if severe headache associated with the dizziness, vomiting, dehydration, any weakness or other symptoms concerning for stroke (slurred speech, trouble thinking).  I am referring you to the ENT for evaluation given the frequency of recurrence, also in the setting of chronic tinnitus and some hearing loss, for further evaluation to ensure there is no other underlying cause.  There does  not seem to be a positional component to your dizziness. If this does develop in the future, then sometimes physical therapy can be a treatment option (only for the positional type of vertigo).

## 2016-12-15 NOTE — Patient Instructions (Signed)
Continue the meclizine as needed for vertigo (found in various over-the-counter forms, including "less drowsy formulation of Dramamine", bonine, etc.  25mg  of meclizine. If used early in treating the vertigo, this may help decrease the nausea. If you have persistent nausea, it is fine to use the ondansetron along with it. Go to the ER if severe headache associated with the dizziness, vomiting, dehydration, any weakness or other symptoms concerning for stroke (slurred speech, trouble thinking).  I am referring you to the ENT for evaluation given the frequency of recurrence, also in the setting of chronic tinnitus and some hearing loss, for further evaluation to ensure there is no other underlying cause.  There does not seem to be a positional component to your dizziness. If this does develop in the future, then sometimes physical therapy can be a treatment option (only for the positional type of vertigo).    Dizziness Dizziness is a common problem. It is a feeling of unsteadiness or light-headedness. You may feel like you are about to faint. Dizziness can lead to injury if you stumble or fall. Anyone can become dizzy, but dizziness is more common in older adults. This condition can be caused by a number of things, including medicines, dehydration, or illness. Follow these instructions at home: Taking these steps may help with your condition: Eating and drinking  Drink enough fluid to keep your urine clear or pale yellow. This helps to keep you from becoming dehydrated. Try to drink more clear fluids, such as water.  Do not drink alcohol.  Limit your caffeine intake if directed by your health care provider.  Limit your salt intake if directed by your health care provider. Activity  Avoid making quick movements. ? Rise slowly from chairs and steady yourself until you feel okay. ? In the morning, first sit up on the side of the bed. When you feel okay, stand slowly while you hold onto  something until you know that your balance is fine.  Move your legs often if you need to stand in one place for a long time. Tighten and relax your muscles in your legs while you are standing.  Do not drive or operate heavy machinery if you feel dizzy.  Avoid bending down if you feel dizzy. Place items in your home so that they are easy for you to reach without leaning over. Lifestyle  Do not use any tobacco products, including cigarettes, chewing tobacco, or electronic cigarettes. If you need help quitting, ask your health care provider.  Try to reduce your stress level, such as with yoga or meditation. Talk with your health care provider if you need help. General instructions  Watch your dizziness for any changes.  Take medicines only as directed by your health care provider. Talk with your health care provider if you think that your dizziness is caused by a medicine that you are taking.  Tell a friend or a family member that you are feeling dizzy. If he or she notices any changes in your behavior, have this person call your health care provider.  Keep all follow-up visits as directed by your health care provider. This is important. Contact a health care provider if:  Your dizziness does not go away.  Your dizziness or light-headedness gets worse.  You feel nauseous.  You have reduced hearing.  You have new symptoms.  You are unsteady on your feet or you feel like the room is spinning. Get help right away if:  You vomit or have diarrhea and  are unable to eat or drink anything.  You have problems talking, walking, swallowing, or using your arms, hands, or legs.  You feel generally weak.  You are not thinking clearly or you have trouble forming sentences. It may take a friend or family member to notice this.  You have chest pain, abdominal pain, shortness of breath, or sweating.  Your vision changes.  You notice any bleeding.  You have a headache.  You have neck  pain or a stiff neck.  You have a fever. This information is not intended to replace advice given to you by your health care provider. Make sure you discuss any questions you have with your health care provider. Document Released: 11/10/2000 Document Revised: 10/23/2015 Document Reviewed: 05/13/2014 Elsevier Interactive Patient Education  2017 [employer]  Labyrinthitis Labyrinthitis is an infection of the inner ear. Your inner ear is a fluid-filled system of tubes and canals (labyrinth). Nerve cells in your inner ear send signals for hearing and balance to your brain. When tiny germs (microorganisms) get inside the labyrinth, they harm the cells that send messages to the brain. Labyrinthitis can cause changes in hearing and balance. Most cases of labyrinthitis come on suddenly and they clear up within weeks. If the infection damages parts of the labyrinth, some symptoms may remain (chronic labyrinthitis). What are the causes? Viruses are the most common cause of labyrinthitis. Viruses that spread into the labyrinth are the same viruses that cause other diseases, such as:  Mononucleosis.  Measles.  Flu.  Herpes.  Bacteria can also cause labyrinthitis when they spread into the labyrinth from an infection in the brain or the middle ear. Bacteria can cause:  Serous labyrinthitis. This type of labyrinthitis develops when bacteria produce a poison (toxin) that gets inside the labyrinth.  Suppurative labyrinthitis. This type of labyrinthitis develops when bacteria get inside the labyrinth.  What increases the risk? You may be at greater risk for labyrinthitis if you:  Drink a lot of alcohol.  Smoke.  Take certain drugs.  Are not well rested (fatigued).  Are under a lot of stress.  Have allergies.  Recently had a nose or throat infection (upper respiratory infection) or an ear infection.  What are the signs or symptoms? Symptoms of labyrinthitis usually start suddenly. The  symptoms can be mild or strong and may include:  Dizziness.  Hearing loss.  A feeling that you are moving when you are not (vertigo).  Ringing in the ear (tinnitus).  Nausea and vomiting.  Trouble focusing your eyes.  Symptoms of chronic labyrinthitis may include:  Fatigue.  Confusion.  Hearing loss.  Tinnitus.  Poor balance.  Vertigo after sudden head movements.  How is this diagnosed? Your health care provider may suspect labyrinthitis if you suddenly get dizzy and lose hearing, especially if you had a recent upper respiratory infection. Your health care provider will perform a physical exam to:  Check your ears for infection.  Test your balance.  Check your eye movement.  Your health care provider may do several tests to rule out other causes of your symptoms and to help make a diagnosis of labyrinthitis. These may include:  Imaging studies, such as a CT scan or an MRI, to look for other causes of your symptoms.  Hearing tests.  Electronystagmography (ENG) to check your balance.  How is this treated? Treatment of labyrinthitis depends on the cause. If your labyrinthitis is caused by a virus, it may get better without treatment. If your labyrinthitis is  caused by bacteria, you may need medicine to fight the infection (antibiotic medicine). You may also have treatment to relieve labyrinthitis symptoms. Treatments may include:  Medicines to: ? Stop dizziness. ? Relieve nausea. ? Treat the inflamed area. ? Speed up your recovery.  Bed rest until dizziness goes away.  Fluids given through an IV tube. You may need this treatment if you have too little fluid in your body (dehydrated) from repeated nausea and vomiting.  Follow these instructions at home:  Take medicines only as directed by your health care provider.  If you were prescribed an antibiotic medicine, finish all of it even if you start to feel better.  Rest as much as possible.  Avoid loud  noises and bright lights.  Do not make sudden movements until any dizziness goes away.  Do not drive until your health care provider says that you can.  Drink enough fluid to keep your urine clear or pale yellow.  Work with a physical therapist if you still feel dizzy after several weeks. A therapist can teach you exercises to help you adjust to feeling dizzy (vestibular rehabilitation exercises).  Keep all follow-up visits as directed by your health care provider. This is important. Contact a health care provider if:  Your symptoms are not relieved by medicines.  Your symptoms last longer than two weeks.  You have a fever. Get help right away if:  You become very dizzy.  You have nausea or vomiting that does not go away.  Your hearing gets much worse very quickly. This information is not intended to replace advice given to you by your health care provider. Make sure you discuss any questions you have with your health care provider. Document Released: 06/07/2014 Document Revised: 10/23/2015 Document Reviewed: 01/16/2014 Elsevier Interactive Patient Education  [employer].

## 2017-01-04 DIAGNOSIS — H9311 Tinnitus, right ear: Secondary | ICD-10-CM | POA: Insufficient documentation

## 2017-01-04 DIAGNOSIS — R42 Dizziness and giddiness: Secondary | ICD-10-CM | POA: Insufficient documentation

## 2017-01-04 DIAGNOSIS — H903 Sensorineural hearing loss, bilateral: Secondary | ICD-10-CM | POA: Insufficient documentation

## 2017-03-02 ENCOUNTER — Other Ambulatory Visit (INDEPENDENT_AMBULATORY_CARE_PROVIDER_SITE_OTHER): Payer: Medicare Other

## 2017-03-02 DIAGNOSIS — Z23 Encounter for immunization: Secondary | ICD-10-CM

## 2017-03-29 DIAGNOSIS — Z1231 Encounter for screening mammogram for malignant neoplasm of breast: Secondary | ICD-10-CM | POA: Diagnosis not present

## 2017-03-29 LAB — HM MAMMOGRAPHY

## 2017-04-01 ENCOUNTER — Encounter: Payer: Self-pay | Admitting: Family Medicine

## 2017-05-17 ENCOUNTER — Encounter: Payer: Self-pay | Admitting: Family Medicine

## 2017-06-06 ENCOUNTER — Telehealth: Payer: Self-pay | Admitting: Family Medicine

## 2017-06-06 NOTE — Telephone Encounter (Signed)
Had phone conversation with Reina Fuse [facility] reqarding patient premiums going up do to [employer] coding.  He will investigate and get back to [place].

## 2017-06-15 ENCOUNTER — Encounter: Payer: Self-pay | Admitting: Family Medicine

## 2017-07-11 ENCOUNTER — Encounter: Payer: Self-pay | Admitting: Family Medicine

## 2017-07-11 ENCOUNTER — Ambulatory Visit (INDEPENDENT_AMBULATORY_CARE_PROVIDER_SITE_OTHER): Payer: Medicare Other | Admitting: Family Medicine

## 2017-07-11 VITALS — BP 118/70 | HR 72 | Ht 67.0 in | Wt 163.4 lb

## 2017-07-11 DIAGNOSIS — I700 Atherosclerosis of aorta: Secondary | ICD-10-CM | POA: Diagnosis not present

## 2017-07-11 NOTE — Progress Notes (Signed)
Chief Complaint  Patient presents with  . Advice Only    wants to discuss atherosclerosis diagnosis and arrange for further cardiac assessments if needed. Going to cost her $80 more a month for health insurance due to this diagnosis.   Patient presents to discuss in detail the atherosclerosis that was noted on x-ray in 2016, in thoracic aorta.  She was trying to switch insurance plans to save money, but due to this diagnosis (that she was "being treated for") it was actually going to cost her $80 more/month, so she ended up staying with her same plan. She denies any neurologic symptoms, h/o TIA's, chest pain or shortness of breath with exertion, claudication or any other symptoms related to arterial blockages.   She remains healthy and active.  She goes to [facility], does 30 minutes on treadmill without any issues.  She is very anxious to understand about athersclerosis--she has done some reading, and is very worried about "blockages", wonders if she needs to see cardiologist, needs additional testing for her heart.  She has some h/o elevated BP's in the past.  She occasionally checks BP's, recalls it being 135/80's  PMH, PSH, SH reviewed  Outpatient Encounter Medications as of 07/11/2017  Medication Sig Note  . ergocalciferol (VITAMIN D2) 50000 units capsule Take 50,000 Units by mouth every 30 (thirty) days.   . Multiple Vitamins-Minerals (CENTRUM ULTRA WOMENS PO) Take 1 tablet by mouth daily.     Marland Kitchen acetaminophen (TYLENOL) 325 MG tablet Take 650 mg by mouth every 6 (six) hours as needed. Reported on 08/25/2015   . hydroquinone 4 % cream Apply topically 2 (two) times daily. (Patient not taking: Reported on 07/11/2017)   . loratadine (CLARITIN) 10 MG tablet Take 10 mg by mouth daily. Reported on 08/25/2015 04/11/2014: Takes as needed in the spring for allergies  . meclizine (ANTIVERT) 25 MG tablet Take 1 tablet (25 mg total) by mouth 3 (three) times daily as needed for dizziness. (Patient not taking:  Reported on 12/15/2016)   . naproxen sodium (ANAPROX) 220 MG tablet Take 440 mg by mouth as needed.   . ondansetron (ZOFRAN ODT) 4 MG disintegrating tablet Take 1 tablet (4 mg total) by mouth every 8 (eight) hours as needed for nausea or vomiting. (Patient not taking: Reported on 12/15/2016)   . valACYclovir (VALTREX) 500 MG tablet Take 500 mg by mouth daily.   . [DISCONTINUED] Vitamin D, Ergocalciferol, (DRISDOL) 50000 units CAPS capsule Take once capsule once weekly for 12 weeks, then as directed by MD 12/15/2016: Weekly rx completed-now taking once a month   No facility-administered encounter medications on file as of 07/11/2017.     ROS: no headaches, dizziness, neurologic symptoms, URI symptoms, fever, chills, GI or GU complaints, no edema, claudication, chest pain, palpitations, shortness of breath, bleeding, bruising, rash or other concerns except as noted in HPI.  PHYSICAL EXAM:  BP 118/70   Pulse 72   Ht 5\' 7"  (1.702 m)   Wt 163 lb 6.4 oz (74.1 kg)   BMI 25.59 kg/m   Well appearing, slightly anxious/concerned female, in no distress No exam was performed, limited to counseling and education (scheduled for CPE in 2 months).  ASSESSMENT/PLAN:  Thoracic aorta atherosclerosis (HCC)  Counseled that the actual atherosclerosis on x-ray is not the concern, but that can be an indication of atherosclerosis in other arteries (ie carotid, peripheral arteries, coronary arteries).  We discussed risk factor modification--regular exercise, keeping blood pressure under control, reviewed lipids.  30 minute visit, all  spent counseling.  She will continue to monitor blood pressure at home, and bring her list to her upcoming wellness visit in April.  Her monitor has previously been verified as accurate.  Blood pressure was excellent today

## 2017-07-11 NOTE — Patient Instructions (Signed)
Atherosclerosis °Atherosclerosis is narrowing and hardening of the blood vessels (arteries). Arteries are tubes that carry blood that contains oxygen from the heart to all parts of the body. Arteries can become narrow or clogged with a buildup of fat, cholesterol, calcium, or other substances (plaque). Plaque decreases the amount of blood that can flow through the artery. °Atherosclerosis can affect any artery in the body, including: °· Heart arteries (coronary artery disease), which may cause heart attack. °· Brain arteries, which may cause stroke. °· Leg, arm, and pelvis arteries (peripheral artery disease), which may cause pain and numbness. °· Kidney arteries, which may cause kidney (renal) failure. ° °Treatment may slow the disease and prevent further damage to the heart, brain, peripheral arteries, and kidneys. °What are the causes? °Atherosclerosis develops when plaque forms in an artery. This damages the inside wall of the artery. Over time, the plaque grows and hardens. It may break through the artery wall. This causes a blood clot to form over the break, which narrows the artery more. The clot may also break loose and travel to other arteries, causing more damage. °What increases the risk? °This condition is more likely to develop in people who: °· Are middle age or older. °· Have a family history of atherosclerosis. °· Have high cholesterol. °· Have high blood fats (triglycerides). °· Have diabetes. °· Are overweight. °· Smoke tobacco. °· Do not exercise enough. °· Have a substance in the blood that indicates increased levels of inflammation in the body (C-reactive protein, or CRP). °· Have sleep apnea. °· Are stressed. °· Drink too much alcohol. ° °What are the signs or symptoms? °This condition may not cause any symptoms. If you do have symptoms, they are caused by damage to an area of your body that is not getting enough blood. The following symptoms are possible: °· Coronary artery disease may cause  chest pain and shortness of breath. °· Decreased blood supply to your brain may cause a stroke. Signs and symptoms of stroke may include sudden: °? Weakness on one side of the body. °? Confusion. °? Changes in vision. °? Inability to speak or understand speech. °? Loss of balance, coordination, or ability to walk. °? Severe headache. °? Loss of consciousness. °· Peripheral artery disease may cause pain and numbness, often in the legs and hips. °· Renal failure may cause fatigue, nausea, swelling, and itchy skin. ° °How is this diagnosed? °This condition is diagnosed based on your medical history and a physical exam. During the exam, your health care provider will check your pulses and listen for a "whooshing" sound over your arteries (bruit). You may have tests, such as: °· Blood tests to check your levels of cholesterol, triglycerides, and CRP. °· Electrocardiogram (ECG) to check for heart damage. °· Chest X-ray to see if your heart is enlarged, which is a sign of heart failure. °· Stress test to see how your heart reacts to exercise. °· Echocardiogram to get images of the inside of your heart. °· Ankle-Brachial index to compare blood pressure in your arms to blood pressure in your ankles. °· Ultrasound of your peripheral arteries to check blood flow. °· CT scan to check for damage to your heart or brain. °· X-rays of blood vessels after dye has been injected (angiogram) to check blood flow. ° °How is this treated? °Treatment starts with lifestyle changes, which may include: °· Changing your diet. °· Losing weight. °· Reducing stress. °· Exercising and being more physically active. °· Not smoking. ° °  You also may need medicine to: °· Lower triglycerides and cholesterol. °· Lower and control blood pressure. °· Prevent blood clots. °· Lower inflammation in your body. °· Lower and control your blood sugar. ° °Sometimes, surgery is needed to remove plaque, widen your artery, or create a new path for your blood  (bypass). Surgical treatment may include: °· Removing plaque from an artery (endarterectomy). °· Opening a narrowed heart artery (angioplasty). °· Heart or peripheral artery bypass graft surgery. ° °Follow these instructions at home: °Lifestyle ° °· Eat a heart-healthy diet. Talk to your health care provider or a diet specialist (dietitian) if you need help. A heart-healthy diet includes: °? Limiting unhealthy fats and increasing healthy fats. Some examples of healthy fats are olive oil and canola oil. °? Eating plant-based foods, such as fruits, vegetables, nuts, legumes, and whole grains. °· Follow an exercise program as told by your health care provider. °· Maintain a healthy weight. Lose weight if directed by your health care provider. °· Rest when you are tired. °· Learn to manage your stress. °· Do not use any tobacco products, such as cigarettes, chewing tobacco, and e-cigarettes. If you need help quitting, ask your health care provider. °· Limit alcohol intake to no more than 1 drink a day for nonpregnant women and 2 drinks a day for men. One drink equals 12 oz of beer, 5 oz of wine, or 1½ oz of hard liquor. °· Do not abuse drugs. °General instructions °· Take over-the-counter and prescription medicines only as told by your health care provider. °· Manage other health conditions as told by your health care provider. °· Keep all follow-up visits as told by your health care provider. This is important. °Contact a health care provider if: °· You have chest pain or discomfort. This includes squeezing chest pain that may feel like indigestion (angina). °· You have shortness of breath. °· You have an irregular heartbeat. °· You have unexplained fatigue. °· You have unexplained pain or numbness in an arm, leg, or hip. °· You have nausea, swelling of your hands or feet, and itchy skin. °Get help right away if: °· You have symptoms of a heart attack, such as: °? Chest pain. °? Shortness of breath. °? Pain in your  neck, jaw, arms, back, or stomach. °? Cold sweat. °? Nausea. °? Light-headedness. °· You have symptoms of a stroke, such as sudden: °? Weakness on one side of your body. °? Confusion. °? Changes in vision. °? Inability to speak or understand speech. °? Loss of balance, coordination, or ability to walk. °? Severe headache. °? Loss of consciousness. °These symptoms may represent a serious problem that is an emergency. Do not wait to see if the symptoms will go away. Get medical help right away. Call your local emergency services (911 in the [place] Do not drive yourself to the hospital. °This information is not intended to replace advice given to you by your health care provider. Make sure you discuss any questions you have with your health care provider. °Document Released: 08/07/2003 Document Revised: 10/23/2015 Document Reviewed: 04/07/2015 °Elsevier Interactive Patient Education © 2018 [employer] ° °

## 2017-07-18 ENCOUNTER — Other Ambulatory Visit: Payer: Self-pay | Admitting: Family Medicine

## 2017-07-18 DIAGNOSIS — E559 Vitamin D deficiency, unspecified: Secondary | ICD-10-CM
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
## 2017-09-05 ENCOUNTER — Ambulatory Visit (INDEPENDENT_AMBULATORY_CARE_PROVIDER_SITE_OTHER): Payer: Medicare Other | Admitting: Family Medicine

## 2017-09-05 ENCOUNTER — Encounter: Payer: Self-pay | Admitting: Family Medicine

## 2017-09-05 VITALS — BP 120/82 | HR 72 | Ht 66.5 in | Wt 161.2 lb

## 2017-09-05 DIAGNOSIS — E559 Vitamin D deficiency, unspecified: Secondary | ICD-10-CM | POA: Diagnosis not present

## 2017-09-05 DIAGNOSIS — I700 Atherosclerosis of aorta: Secondary | ICD-10-CM | POA: Diagnosis not present

## 2017-09-05 DIAGNOSIS — A6000 Herpesviral infection of urogenital system, unspecified: Secondary | ICD-10-CM | POA: Diagnosis not present

## 2017-09-05 DIAGNOSIS — E7800 Pure hypercholesterolemia, unspecified: Secondary | ICD-10-CM | POA: Diagnosis not present

## 2017-09-05 DIAGNOSIS — R5383 Other fatigue: Secondary | ICD-10-CM | POA: Diagnosis not present

## 2017-09-05 DIAGNOSIS — Z0000 Encounter for general adult medical examination without abnormal findings: Secondary | ICD-10-CM

## 2017-09-05 DIAGNOSIS — L659 Nonscarring hair loss, unspecified: Secondary | ICD-10-CM | POA: Diagnosis not present

## 2017-09-05 NOTE — Patient Instructions (Addendum)
HEALTH MAINTENANCE RECOMMENDATIONS:  It is recommended that you get at least 30 minutes of aerobic exercise at least 5 days/week (for weight loss, you may need as much as 60-90 minutes). This can be any activity that gets your heart rate up. This can be divided in 10-15 minute intervals if needed, but try and build up your endurance at least once a week.  Weight bearing exercise is also recommended twice weekly.  Eat a healthy diet with lots of vegetables, fruits and fiber.  "Colorful" foods have a lot of vitamins (ie green vegetables, tomatoes, red peppers, etc).  Limit sweet tea, regular sodas and alcoholic beverages, all of which has a lot of calories and sugar.  Up to 1 alcoholic drink daily may be beneficial for women (unless trying to lose weight, watch sugars).  Drink a lot of water.  Calcium recommendations are 1200-1500 mg daily (1500 mg for postmenopausal women or women without ovaries), and vitamin D 1000 IU daily.  This should be obtained from diet and/or supplements (vitamins), and calcium should not be taken all at once, but in divided doses.  Monthly self breast exams and yearly mammograms for women over the age of 50 is recommended.  Sunscreen of at least SPF 30 should be used on all sun-exposed parts of the skin when outside between the hours of 10 am and 4 pm (not just when at beach or pool, but even with exercise, golf, tennis, and yard work!)  Use a sunscreen that says "broad spectrum" so it covers both UVA and UVB rays, and make sure to reapply every 1-2 hours.  Remember to change the batteries in your smoke detectors when changing your clock times in the spring and fall.  Use your seat belt every time you are in a car, and please drive safely and not be distracted with cell phones and texting while driving.   Annette Monroe , Thank you for taking time to come for your Medicare Wellness Visit. I appreciate your ongoing commitment to your health goals. Please review the  following plan we discussed and let me know if I can assist you in the future.   These are the goals we discussed: Goals    None      This is a list of the screening recommended for you and due dates:  Health Maintenance  Topic Date Due  . Colon Cancer Screening  10/16/2017  . Flu Shot  12/29/2017  . Mammogram  03/30/2019  . Tetanus Vaccine  02/07/2021  . DEXA scan (bone density measurement)  Completed  .  Hepatitis C: One time screening is recommended by Center for Disease Control  (CDC) for  adults born from [birth year] through 1963.   Completed  . Pneumonia vaccines  Completed   You are next due for colon cancer screening in 08/2018 (3 years from last cologard).  You can decide whether you want colonoscopy vs repeat cologard. Continue yearly mammograms (due again 02/2018, not 2020 as stated above). Bone density follow-up per your GYN (if it was normal, repeat may not be needed).  I recommend getting the new shingles vaccine (Shingrix). You will need to check with your insurance to see if it is covered, and if covered by Medicare Part D, you need to get from the pharmacy rather than our office.  It is a series of 2 injections, spaced 2 months apart.  Consider enteric coated 81mg  of aspirin daily. We will be in touch with your test results in 1-2 days.  Continue to monitor blood pressure at home. I recommend rechecking a few minutes later if initial blood pressure is high.  Please record on the sheet given and feel free to mail/fax them to [place] next month.

## 2017-09-06 LAB — CBC WITH DIFFERENTIAL/PLATELET
Basophils Absolute: 0 10*3/uL (ref 0.0–0.2)
Basos: 0 %
EOS (ABSOLUTE): 0.1 10*3/uL (ref 0.0–0.4)
Eos: 2 %
Hematocrit: 42.3 % (ref 34.0–46.6)
Hemoglobin: 14.2 g/dL (ref 11.1–15.9)
IMMATURE GRANULOCYTES: 0 %
Immature Grans (Abs): 0 10*3/uL (ref 0.0–0.1)
LYMPHS ABS: 2.4 10*3/uL (ref 0.7–3.1)
Lymphs: 40 %
MCH: 31.8 pg (ref 26.6–33.0)
MCHC: 33.6 g/dL (ref 31.5–35.7)
MCV: 95 fL (ref 79–97)
MONOS ABS: 0.3 10*3/uL (ref 0.1–0.9)
Monocytes: 5 %
NEUTROS PCT: 53 %
Neutrophils Absolute: 3.2 10*3/uL (ref 1.4–7.0)
Platelets: 310 10*3/uL (ref 150–379)
RBC: 4.46 x10E6/uL (ref 3.77–5.28)
RDW: 13.1 % (ref 12.3–15.4)
WBC: 6.1 10*3/uL (ref 3.4–10.8)

## 2017-09-06 LAB — COMPREHENSIVE METABOLIC PANEL
ALT: 15 IU/L (ref 0–32)
AST: 20 IU/L (ref 0–40)
Albumin/Globulin Ratio: 1.7 (ref 1.2–2.2)
Albumin: 4.3 g/dL (ref 3.6–4.8)
Alkaline Phosphatase: 93 IU/L (ref 39–117)
BILIRUBIN TOTAL: 0.5 mg/dL (ref 0.0–1.2)
BUN / CREAT RATIO: 14 (ref 12–28)
BUN: 12 mg/dL (ref 8–27)
CHLORIDE: 103 mmol/L (ref 96–106)
CO2: 24 mmol/L (ref 20–29)
Calcium: 9.7 mg/dL (ref 8.7–10.3)
Creatinine, Ser: 0.83 mg/dL (ref 0.57–1.00)
GFR calc non Af Amer: 72 mL/min/{1.73_m2} (ref >59)
GFR, EST AFRICAN AMERICAN: 83 mL/min/{1.73_m2} (ref >59)
GLOBULIN, TOTAL: 2.6 g/dL (ref 1.5–4.5)
Glucose: 92 mg/dL (ref 65–99)
POTASSIUM: 4.4 mmol/L (ref 3.5–5.2)
SODIUM: 140 mmol/L (ref 134–144)
TOTAL PROTEIN: 6.9 g/dL (ref 6.0–8.5)

## 2017-09-06 LAB — LIPID PANEL
Chol/HDL Ratio: 3.6 ratio (ref 0.0–4.4)
Cholesterol, Total: 200 mg/dL — ABNORMAL HIGH (ref 100–199)
HDL: 55 mg/dL (ref >39)
LDL Calculated: 128 mg/dL — ABNORMAL HIGH (ref 0–99)
Triglycerides: 86 mg/dL (ref 0–149)
VLDL Cholesterol Cal: 17 mg/dL (ref 5–40)

## 2017-09-06 LAB — VITAMIN D 25 HYDROXY (VIT D DEFICIENCY, FRACTURES): VIT D 25 HYDROXY: 31.4 ng/mL (ref 30.0–100.0)

## 2017-09-06 LAB — TSH: TSH: 4.01 u[IU]/mL (ref 0.450–4.500)

## 2017-10-12 DIAGNOSIS — Z124 Encounter for screening for malignant neoplasm of cervix: Secondary | ICD-10-CM | POA: Diagnosis not present

## 2017-10-12 DIAGNOSIS — Z6825 Body mass index (BMI) 25.0-25.9, adult: Secondary | ICD-10-CM | POA: Diagnosis not present

## 2017-10-21 DIAGNOSIS — R87615 Unsatisfactory cytologic smear of cervix: Secondary | ICD-10-CM | POA: Diagnosis not present

## 2017-10-21 LAB — HM PAP SMEAR: HM Pap smear: NORMAL

## 2017-12-19 DIAGNOSIS — H35033 Hypertensive retinopathy, bilateral: Secondary | ICD-10-CM | POA: Diagnosis not present

## 2017-12-19 DIAGNOSIS — H40013 Open angle with borderline findings, low risk, bilateral: Secondary | ICD-10-CM | POA: Diagnosis not present

## 2017-12-19 DIAGNOSIS — H25013 Cortical age-related cataract, bilateral: Secondary | ICD-10-CM | POA: Diagnosis not present

## 2017-12-19 DIAGNOSIS — H2513 Age-related nuclear cataract, bilateral: Secondary | ICD-10-CM | POA: Diagnosis not present

## 2018-03-27 DIAGNOSIS — K648 Other hemorrhoids: Secondary | ICD-10-CM | POA: Diagnosis not present

## 2018-03-27 DIAGNOSIS — Z1211 Encounter for screening for malignant neoplasm of colon: Secondary | ICD-10-CM | POA: Diagnosis not present

## 2018-03-27 DIAGNOSIS — K5730 Diverticulosis of large intestine without perforation or abscess without bleeding: Secondary | ICD-10-CM | POA: Diagnosis not present

## 2018-03-27 LAB — HM COLONOSCOPY

## 2018-03-29 ENCOUNTER — Other Ambulatory Visit (INDEPENDENT_AMBULATORY_CARE_PROVIDER_SITE_OTHER): Payer: Medicare Other

## 2018-03-29 DIAGNOSIS — Z23 Encounter for immunization: Secondary | ICD-10-CM | POA: Diagnosis not present

## 2018-04-06 DIAGNOSIS — Z1231 Encounter for screening mammogram for malignant neoplasm of breast: Secondary | ICD-10-CM | POA: Diagnosis not present

## 2018-04-06 LAB — HM MAMMOGRAPHY

## 2018-05-19 DIAGNOSIS — D1030 Benign neoplasm of unspecified part of mouth: Secondary | ICD-10-CM | POA: Diagnosis not present

## 2018-06-06 DIAGNOSIS — D1030 Benign neoplasm of unspecified part of mouth: Secondary | ICD-10-CM | POA: Diagnosis not present

## 2018-09-07 ENCOUNTER — Ambulatory Visit: Payer: Medicare Other | Admitting: Family Medicine

## 2018-12-26 DIAGNOSIS — H35033 Hypertensive retinopathy, bilateral: Secondary | ICD-10-CM | POA: Diagnosis not present

## 2018-12-26 DIAGNOSIS — H2513 Age-related nuclear cataract, bilateral: Secondary | ICD-10-CM | POA: Diagnosis not present

## 2018-12-26 DIAGNOSIS — H40013 Open angle with borderline findings, low risk, bilateral: Secondary | ICD-10-CM | POA: Diagnosis not present

## 2018-12-26 DIAGNOSIS — H524 Presbyopia: Secondary | ICD-10-CM | POA: Diagnosis not present

## 2018-12-26 DIAGNOSIS — H25013 Cortical age-related cataract, bilateral: Secondary | ICD-10-CM | POA: Diagnosis not present

## 2019-01-22 ENCOUNTER — Telehealth: Payer: Self-pay | Admitting: *Deleted

## 2019-01-22 DIAGNOSIS — E7800 Pure hypercholesterolemia, unspecified: Secondary | ICD-10-CM

## 2019-01-22 DIAGNOSIS — R5383 Other fatigue: Secondary | ICD-10-CM

## 2019-01-22 DIAGNOSIS — E559 Vitamin D deficiency, unspecified: Secondary | ICD-10-CM

## 2019-01-22 DIAGNOSIS — I700 Atherosclerosis of aorta: Secondary | ICD-10-CM

## 2019-01-22 NOTE — Telephone Encounter (Signed)
Orders entered

## 2019-01-22 NOTE — Telephone Encounter (Signed)
Patient called and would like to come this Wed 01/24/2019 for labs for her AWV next Monday 01/29/2019. Need orders please.

## 2019-01-24 ENCOUNTER — Other Ambulatory Visit: Payer: Medicare Other

## 2019-01-24 ENCOUNTER — Other Ambulatory Visit: Payer: Self-pay

## 2019-01-24 DIAGNOSIS — I700 Atherosclerosis of aorta: Secondary | ICD-10-CM

## 2019-01-24 DIAGNOSIS — E7800 Pure hypercholesterolemia, unspecified: Secondary | ICD-10-CM

## 2019-01-24 DIAGNOSIS — E559 Vitamin D deficiency, unspecified: Secondary | ICD-10-CM | POA: Diagnosis not present

## 2019-01-24 DIAGNOSIS — R5383 Other fatigue: Secondary | ICD-10-CM

## 2019-01-25 LAB — COMPREHENSIVE METABOLIC PANEL
ALT: 18 IU/L (ref 0–32)
AST: 19 IU/L (ref 0–40)
Albumin/Globulin Ratio: 1.6 (ref 1.2–2.2)
Albumin: 4 g/dL (ref 3.8–4.8)
Alkaline Phosphatase: 92 IU/L (ref 39–117)
BUN/Creatinine Ratio: 18 (ref 12–28)
BUN: 14 mg/dL (ref 8–27)
Bilirubin Total: 0.4 mg/dL (ref 0.0–1.2)
CO2: 23 mmol/L (ref 20–29)
Calcium: 9.5 mg/dL (ref 8.7–10.3)
Chloride: 103 mmol/L (ref 96–106)
Creatinine, Ser: 0.8 mg/dL (ref 0.57–1.00)
GFR calc Af Amer: 86 mL/min/{1.73_m2} (ref >59)
GFR calc non Af Amer: 75 mL/min/{1.73_m2} (ref >59)
Globulin, Total: 2.5 g/dL (ref 1.5–4.5)
Glucose: 88 mg/dL (ref 65–99)
Potassium: 4.3 mmol/L (ref 3.5–5.2)
Sodium: 140 mmol/L (ref 134–144)
Total Protein: 6.5 g/dL (ref 6.0–8.5)

## 2019-01-25 LAB — CBC WITH DIFFERENTIAL/PLATELET
Basophils Absolute: 0 10*3/uL (ref 0.0–0.2)
Basos: 1 %
EOS (ABSOLUTE): 0.1 10*3/uL (ref 0.0–0.4)
Eos: 2 %
Hematocrit: 40.6 % (ref 34.0–46.6)
Hemoglobin: 13.7 g/dL (ref 11.1–15.9)
Immature Grans (Abs): 0 10*3/uL (ref 0.0–0.1)
Immature Granulocytes: 0 %
Lymphocytes Absolute: 2.5 10*3/uL (ref 0.7–3.1)
Lymphs: 41 %
MCH: 31.4 pg (ref 26.6–33.0)
MCHC: 33.7 g/dL (ref 31.5–35.7)
MCV: 93 fL (ref 79–97)
Monocytes Absolute: 0.4 10*3/uL (ref 0.1–0.9)
Monocytes: 6 %
Neutrophils Absolute: 3.1 10*3/uL (ref 1.4–7.0)
Neutrophils: 50 %
Platelets: 304 10*3/uL (ref 150–450)
RBC: 4.36 x10E6/uL (ref 3.77–5.28)
RDW: 12.3 % (ref 11.7–15.4)
WBC: 6.2 10*3/uL (ref 3.4–10.8)

## 2019-01-25 LAB — LIPID PANEL
Chol/HDL Ratio: 3.5 ratio (ref 0.0–4.4)
Cholesterol, Total: 164 mg/dL (ref 100–199)
HDL: 47 mg/dL (ref >39)
LDL Calculated: 90 mg/dL (ref 0–99)
Triglycerides: 136 mg/dL (ref 0–149)
VLDL Cholesterol Cal: 27 mg/dL (ref 5–40)

## 2019-01-25 LAB — VITAMIN D 25 HYDROXY (VIT D DEFICIENCY, FRACTURES): Vit D, 25-Hydroxy: 26.7 ng/mL — ABNORMAL LOW (ref 30.0–100.0)

## 2019-01-25 LAB — TSH: TSH: 5.28 u[IU]/mL — ABNORMAL HIGH (ref 0.450–4.500)
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
## 2019-01-28 NOTE — Patient Instructions (Addendum)
HEALTH MAINTENANCE RECOMMENDATIONS:  It is recommended that you get at least 30 minutes of aerobic exercise at least 5 days/week (for weight loss, you may need as much as 60-90 minutes). This can be any activity that gets your heart rate up. This can be divided in 10-15 minute intervals if needed, but try and build up your endurance at least once a week.  Weight bearing exercise is also recommended twice weekly.  Eat a healthy diet with lots of vegetables, fruits and fiber.  "Colorful" foods have a lot of vitamins (ie green vegetables, tomatoes, red peppers, etc).  Limit sweet tea, regular sodas and alcoholic beverages, all of which has a lot of calories and sugar.  Up to 1 alcoholic drink daily may be beneficial for women (unless trying to lose weight, watch sugars).  Drink a lot of water.  Calcium recommendations are 1200-1500 mg daily (1500 mg for postmenopausal women or women without ovaries), and vitamin D 1000 IU daily.  This should be obtained from diet and/or supplements (vitamins), and calcium should not be taken all at once, but in divided doses.  Monthly self breast exams and yearly mammograms for women over the age of 79 is recommended.  Sunscreen of at least SPF 30 should be used on all sun-exposed parts of the skin when outside between the hours of 10 am and 4 pm (not just when at beach or pool, but even with exercise, golf, tennis, and yard work!)  Use a sunscreen that says "broad spectrum" so it covers both UVA and UVB rays, and make sure to reapply every 1-2 hours.  Remember to change the batteries in your smoke detectors when changing your clock times in the spring and fall. Carbon monoxide detectors are recommended for your home.  Use your seat belt every time you are in a car, and please drive safely and not be distracted with cell phones and texting while driving.   Annette Monroe , Thank you for taking time to come for your Medicare Wellness Visit. I appreciate your ongoing  commitment to your health goals. Please review the following plan we discussed and let me know if I can assist you in the future.   This is a list of the screening recommended for you and due dates:  Health Maintenance  Topic Date Due  . Flu Shot  12/30/2018  . Mammogram  04/06/2020  . Tetanus Vaccine  02/07/2021  . Colon Cancer Screening  03/27/2028  . DEXA scan (bone density measurement)  Completed  .  Hepatitis C: One time screening is recommended by Center for Disease Control  (CDC) for  adults born from [birth year] through 1963.   Completed  . Pneumonia vaccines  Completed   You elected to hold off on getting your flu shot until later . Please get it by mid-September (high dose is preferred).  I recommend getting the new shingles vaccine (Shingrix). You will need to get it from the pharmacy, as it is covered by Medicare Part D.  It is a series of 2 injections, spaced 2 months apart.  Your vitamin D level was a little low, due to running out of the once monthly prescription.  Take one now, and then start monthly on the 15th of the month again, on 9/15.  Try and increase your exercise back to a more regular regimen.  Contact us if you develop more thyroid symptoms (hair loss, fatigue, weight gain, constipation, etc.), otherwise we will just recheck your thyroid in 3 months.  Your blood pressures in the afternoon are higher than they should be. Monitor your blood pressure more regularly--with resuming a more regular exercise routine, and also cutting back on the sodium in your diet.  Please bring your list of blood pressures with you when you come for your labs in 3 months. Goal blood pressure is under 130/80 (borderline is up to 135/85).  If consistently > 135-140/85-90 needs treatment.   Low-Sodium Eating Plan Sodium, which is an element that makes up salt, helps you maintain a healthy balance of fluids in your body. Too much sodium can increase your blood pressure and cause fluid and  waste to be held in your body. Your health care provider or dietitian may recommend following this plan if you have high blood pressure (hypertension), kidney disease, liver disease, or heart failure. Eating less sodium can help lower your blood pressure, reduce swelling, and protect your heart, liver, and kidneys. What are tips for following this plan? General guidelines  Most people on this plan should limit their sodium intake to 1,500-2,000 mg (milligrams) of sodium each day. Reading food labels   The Nutrition Facts label lists the amount of sodium in one serving of the food. If you eat more than one serving, you must multiply the listed amount of sodium by the number of servings.  Choose foods with less than 140 mg of sodium per serving.  Avoid foods with 300 mg of sodium or more per serving. Shopping  Look for lower-sodium products, often labeled as "low-sodium" or "no salt added."  Always check the sodium content even if foods are labeled as "unsalted" or "no salt added".  Buy fresh foods. ? Avoid canned foods and premade or frozen meals. ? Avoid canned, cured, or processed meats  Buy breads that have less than 80 mg of sodium per slice. Cooking  Eat more home-cooked food and less restaurant, buffet, and fast food.  Avoid adding salt when cooking. Use salt-free seasonings or herbs instead of table salt or sea salt. Check with your health care provider or pharmacist before using salt substitutes.  Cook with plant-based oils, such as canola, sunflower, or olive oil. Meal planning  When eating at a restaurant, ask that your food be prepared with less salt or no salt, if possible.  Avoid foods that contain MSG (monosodium glutamate). MSG is sometimes added to [place] food, bouillon, and some canned foods. What foods are recommended? The items listed may not be a complete list. Talk with your dietitian about what dietary choices are best for you. Grains Low-sodium cereals,  including oats, puffed wheat and rice, and shredded wheat. Low-sodium crackers. Unsalted rice. Unsalted pasta. Low-sodium bread. Whole-grain breads and whole-grain pasta. Vegetables Fresh or frozen vegetables. "No salt added" canned vegetables. "No salt added" tomato sauce and paste. Low-sodium or reduced-sodium tomato and vegetable juice. Fruits Fresh, frozen, or canned fruit. Fruit juice. Meats and other protein foods Fresh or frozen (no salt added) meat, poultry, seafood, and fish. Low-sodium canned tuna and salmon. Unsalted nuts. Dried peas, beans, and lentils without added salt. Unsalted canned beans. Eggs. Unsalted nut butters. Dairy Milk. Soy milk. Cheese that is naturally low in sodium, such as ricotta cheese, fresh mozzarella, or Swiss cheese Low-sodium or reduced-sodium cheese. Cream cheese. Yogurt. Fats and oils Unsalted butter. Unsalted margarine with no trans fat. Vegetable oils such as canola or olive oils. Seasonings and other foods Fresh and dried herbs and spices. Salt-free seasonings. Low-sodium mustard and ketchup. Sodium-free salad dressing. Sodium-free light  mayonnaise. Fresh or refrigerated horseradish. Lemon juice. Vinegar. Homemade, reduced-sodium, or low-sodium soups. Unsalted popcorn and pretzels. Low-salt or salt-free chips. What foods are not recommended? The items listed may not be a complete list. Talk with your dietitian about what dietary choices are best for you. Grains Instant hot cereals. Bread stuffing, pancake, and biscuit mixes. Croutons. Seasoned rice or pasta mixes. Noodle soup cups. Boxed or frozen macaroni and cheese. Regular salted crackers. Self-rising flour. Vegetables Sauerkraut, pickled vegetables, and relishes. Olives. [place] fries. Onion rings. Regular canned vegetables (not low-sodium or reduced-sodium). Regular canned tomato sauce and paste (not low-sodium or reduced-sodium). Regular tomato and vegetable juice (not low-sodium or reduced-sodium).  Frozen vegetables in sauces. Meats and other protein foods Meat or fish that is salted, canned, smoked, spiced, or pickled. Bacon, ham, sausage, hotdogs, corned beef, chipped beef, packaged lunch meats, salt pork, jerky, pickled herring, anchovies, regular canned tuna, sardines, salted nuts. Dairy Processed cheese and cheese spreads. Cheese curds. Blue cheese. Feta cheese. String cheese. Regular cottage cheese. Buttermilk. Canned milk. Fats and oils Salted butter. Regular margarine. Ghee. Bacon fat. Seasonings and other foods Onion salt, garlic salt, seasoned salt, table salt, and sea salt. Canned and packaged gravies. Worcestershire sauce. Tartar sauce. Barbecue sauce. Teriyaki sauce. Soy sauce, including reduced-sodium. Steak sauce. Fish sauce. Oyster sauce. Cocktail sauce. Horseradish that you find on the shelf. Regular ketchup and mustard. Meat flavorings and tenderizers. Bouillon cubes. Hot sauce and Tabasco sauce. Premade or packaged marinades. Premade or packaged taco seasonings. Relishes. Regular salad dressings. Salsa. Potato and tortilla chips. Corn chips and puffs. Salted popcorn and pretzels. Canned or dried soups. Pizza. Frozen entrees and pot pies. Summary  Eating less sodium can help lower your blood pressure, reduce swelling, and protect your heart, liver, and kidneys.  Most people on this plan should limit their sodium intake to 1,500-2,000 mg (milligrams) of sodium each day.  Canned, boxed, and frozen foods are high in sodium. Restaurant foods, fast foods, and pizza are also very high in sodium. You also get sodium by adding salt to food.  Try to cook at home, eat more fresh fruits and vegetables, and eat less fast food, canned, processed, or prepared foods. This information is not intended to replace advice given to you by your health care provider. Make sure you discuss any questions you have with your health care provider. Document Released: 11/06/2001 Document Revised:  04/29/2017 Document Reviewed: 05/10/2016 Elsevier Patient Education  2020 [employer].

## 2019-01-29 ENCOUNTER — Encounter: Payer: Self-pay | Admitting: Family Medicine

## 2019-01-29 ENCOUNTER — Other Ambulatory Visit: Payer: Self-pay

## 2019-01-29 ENCOUNTER — Ambulatory Visit (INDEPENDENT_AMBULATORY_CARE_PROVIDER_SITE_OTHER): Payer: Medicare Other | Admitting: Family Medicine

## 2019-01-29 VITALS — BP 146/70 | HR 80 | Temp 98.2°F | Ht 67.0 in | Wt 159.0 lb

## 2019-01-29 DIAGNOSIS — R7989 Other specified abnormal findings of blood chemistry: Secondary | ICD-10-CM

## 2019-01-29 DIAGNOSIS — E7800 Pure hypercholesterolemia, unspecified: Secondary | ICD-10-CM

## 2019-01-29 DIAGNOSIS — E559 Vitamin D deficiency, unspecified: Secondary | ICD-10-CM | POA: Diagnosis not present

## 2019-01-29 DIAGNOSIS — R030 Elevated blood-pressure reading, without diagnosis of hypertension: Secondary | ICD-10-CM

## 2019-01-29 DIAGNOSIS — E039 Hypothyroidism, unspecified: Secondary | ICD-10-CM | POA: Diagnosis not present

## 2019-01-29 DIAGNOSIS — L659 Nonscarring hair loss, unspecified: Secondary | ICD-10-CM

## 2019-01-29 DIAGNOSIS — Z0000 Encounter for general adult medical examination without abnormal findings: Secondary | ICD-10-CM

## 2019-01-29 DIAGNOSIS — I700 Atherosclerosis of aorta: Secondary | ICD-10-CM | POA: Diagnosis not present

## 2019-01-29 DIAGNOSIS — E038 Other specified hypothyroidism: Secondary | ICD-10-CM

## 2019-01-29 MED ORDER — ERGOCALCIFEROL 1.25 MG (50000 UT) PO CAPS: 50000.0000 [IU] | ORAL_CAPSULE | ORAL | 0 refills | Status: DC

## 2019-02-07 ENCOUNTER — Telehealth: Payer: Self-pay | Admitting: Family Medicine

## 2019-02-07 NOTE — Telephone Encounter (Signed)
Requested records received from [facility] °

## 2019-02-08 ENCOUNTER — Encounter: Payer: Self-pay | Admitting: *Deleted

## 2019-02-16 ENCOUNTER — Telehealth: Payer: Self-pay | Admitting: Family Medicine

## 2019-02-16 NOTE — Telephone Encounter (Signed)
Requested records received from [facility] °

## 2019-03-05 ENCOUNTER — Other Ambulatory Visit: Payer: Self-pay

## 2019-03-05 ENCOUNTER — Other Ambulatory Visit: Payer: Medicare Other

## 2019-03-05 DIAGNOSIS — Z23 Encounter for immunization: Secondary | ICD-10-CM

## 2019-04-10 DIAGNOSIS — Z01419 Encounter for gynecological examination (general) (routine) without abnormal findings: Secondary | ICD-10-CM | POA: Diagnosis not present

## 2019-04-10 DIAGNOSIS — A609 Anogenital herpesviral infection, unspecified: Secondary | ICD-10-CM | POA: Diagnosis not present

## 2019-04-10 DIAGNOSIS — Z6825 Body mass index (BMI) 25.0-25.9, adult: Secondary | ICD-10-CM | POA: Diagnosis not present

## 2019-04-12 DIAGNOSIS — Z1231 Encounter for screening mammogram for malignant neoplasm of breast: Secondary | ICD-10-CM | POA: Diagnosis not present

## 2019-04-12 LAB — HM MAMMOGRAPHY

## 2019-04-25 ENCOUNTER — Other Ambulatory Visit: Payer: Self-pay

## 2019-04-30 ENCOUNTER — Telehealth: Payer: Self-pay | Admitting: *Deleted

## 2019-04-30 ENCOUNTER — Other Ambulatory Visit: Payer: Medicare Other

## 2019-04-30 DIAGNOSIS — E039 Hypothyroidism, unspecified: Secondary | ICD-10-CM

## 2019-04-30 DIAGNOSIS — E038 Other specified hypothyroidism: Secondary | ICD-10-CM

## 2019-04-30 DIAGNOSIS — R7989 Other specified abnormal findings of blood chemistry: Secondary | ICD-10-CM

## 2019-04-30 NOTE — Telephone Encounter (Signed)
Patient was here for labs and dropped off some bp readings-I put them in your folder for review.

## 2019-05-01 LAB — THYROID PEROXIDASE ANTIBODY: Thyroperoxidase Ab SerPl-aCnc: <9 IU/mL (ref 0–34)

## 2019-05-01 LAB — TSH: TSH: 6.24 u[IU]/mL — ABNORMAL HIGH (ref 0.450–4.500)

## 2019-05-02 NOTE — Telephone Encounter (Signed)
Left pt a VM to call the office and schedule 

## 2019-05-02 NOTE — Telephone Encounter (Signed)
Please advise pt of need for OV (can be virtual)--BP's are above goal, and TSH was a little elevated.  I'd like visit to discuss both issues please.  BP's [id number] (see sheet sent by pt).

## 2019-05-05 NOTE — Patient Instructions (Addendum)
Start taking HCTZ 12.5mg  once daily. Be sure to try and get some potassium-rich foods in your diet (ie 1/2 banana most days), and stay well hydrated. I don't know if this dose alone would be enough to get blood pressure to goal (under 130/80), but am fairly confident that this medication along with regular exercise should get you there. Be in touch with any questions, concerns and blood pressure readings. Bring your list to you next visit.   Hypertension, Adult High blood pressure (hypertension) is when the force of blood pumping through the arteries is too strong. The arteries are the blood vessels that carry blood from the heart throughout the body. Hypertension forces the heart to work harder to pump blood and may cause arteries to become narrow or stiff. Untreated or uncontrolled hypertension can cause a heart attack, heart failure, a stroke, kidney disease, and other problems. A blood pressure reading consists of a higher number over a lower number. Ideally, your blood pressure should be below 120/80. The first ("top") number is called the systolic pressure. It is a measure of the pressure in your arteries as your heart beats. The second ("bottom") number is called the diastolic pressure. It is a measure of the pressure in your arteries as the heart relaxes. What are the causes? The exact cause of this condition is not known. There are some conditions that result in or are related to high blood pressure. What increases the risk? Some risk factors for high blood pressure are under your control. The following factors may make you more likely to develop this condition:  Smoking.  Having type 2 diabetes mellitus, high cholesterol, or both.  Not getting enough exercise or physical activity.  Being overweight.  Having too much fat, sugar, calories, or salt (sodium) in your diet.  Drinking too much alcohol. Some risk factors for high blood pressure may be difficult or impossible to change.  Some of these factors include:  Having chronic kidney disease.  Having a family history of high blood pressure.  Age. Risk increases with age.  Race. You may be at higher risk if you are African American.  Gender. Men are at higher risk than women before age 34. After age 40, women are at higher risk than men.  Having obstructive sleep apnea.  Stress. What are the signs or symptoms? High blood pressure may not cause symptoms. Very high blood pressure (hypertensive crisis) may cause:  Headache.  Anxiety.  Shortness of breath.  Nosebleed.  Nausea and vomiting.  Vision changes.  Severe chest pain.  Seizures. How is this diagnosed? This condition is diagnosed by measuring your blood pressure while you are seated, with your arm resting on a flat surface, your legs uncrossed, and your feet flat on the floor. The cuff of the blood pressure monitor will be placed directly against the skin of your upper arm at the level of your heart. It should be measured at least twice using the same arm. Certain conditions can cause a difference in blood pressure between your right and left arms. Certain factors can cause blood pressure readings to be lower or higher than normal for a short period of time:  When your blood pressure is higher when you are in a health care provider's office than when you are at home, this is called white coat hypertension. Most people with this condition do not need medicines.  When your blood pressure is higher at home than when you are in a health care provider's office,  this is called masked hypertension. Most people with this condition may need medicines to control blood pressure. If you have a high blood pressure reading during one visit or you have normal blood pressure with other risk factors, you may be asked to:  Return on a different day to have your blood pressure checked again.  Monitor your blood pressure at home for 1 week or longer. If you are  diagnosed with hypertension, you may have other blood or imaging tests to help your health care provider understand your overall risk for other conditions. How is this treated? This condition is treated by making healthy lifestyle changes, such as eating healthy foods, exercising more, and reducing your alcohol intake. Your health care provider may prescribe medicine if lifestyle changes are not enough to get your blood pressure under control, and if:  Your systolic blood pressure is above 130.  Your diastolic blood pressure is above 80. Your personal target blood pressure may vary depending on your medical conditions, your age, and other factors. Follow these instructions at home: Eating and drinking   Eat a diet that is high in fiber and potassium, and low in sodium, added sugar, and fat. An example eating plan is called the DASH (Dietary Approaches to Stop Hypertension) diet. To eat this way: ? Eat plenty of fresh fruits and vegetables. Try to fill one half of your plate at each meal with fruits and vegetables. ? Eat whole grains, such as whole-wheat pasta, brown rice, or whole-grain bread. Fill about one fourth of your plate with whole grains. ? Eat or drink low-fat dairy products, such as skim milk or low-fat yogurt. ? Avoid fatty cuts of meat, processed or cured meats, and poultry with skin. Fill about one fourth of your plate with lean proteins, such as fish, chicken without skin, beans, eggs, or tofu. ? Avoid pre-made and processed foods. These tend to be higher in sodium, added sugar, and fat.  Reduce your daily sodium intake. Most people with hypertension should eat less than 1,500 mg of sodium a day.  Do not drink alcohol if: ? Your health care provider tells you not to drink. ? You are pregnant, may be pregnant, or are planning to become pregnant.  If you drink alcohol: ? Limit how much you use to:  0-1 drink a day for women.  0-2 drinks a day for men. ? Be aware of how  much alcohol is in your drink. In the [place] one drink equals one 12 oz bottle of beer (355 mL), one 5 oz glass of wine (148 mL), or one 1 oz glass of hard liquor (44 mL). Lifestyle   Work with your health care provider to maintain a healthy body weight or to lose weight. Ask what an ideal weight is for you.  Get at least 30 minutes of exercise most days of the week. Activities may include walking, swimming, or biking.  Include exercise to strengthen your muscles (resistance exercise), such as Pilates or lifting weights, as part of your weekly exercise routine. Try to do these types of exercises for 30 minutes at least 3 days a week.  Do not use any products that contain nicotine or tobacco, such as cigarettes, e-cigarettes, and chewing tobacco. If you need help quitting, ask your health care provider.  Monitor your blood pressure at home as told by your health care provider.  Keep all follow-up visits as told by your health care provider. This is important. Medicines  Take over-the-counter and prescription  medicines only as told by your health care provider. Follow directions carefully. Blood pressure medicines must be taken as prescribed.  Do not skip doses of blood pressure medicine. Doing this puts you at risk for problems and can make the medicine less effective.  Ask your health care provider about side effects or reactions to medicines that you should watch for. Contact a health care provider if you:  Think you are having a reaction to a medicine you are taking.  Have headaches that keep coming back (recurring).  Feel dizzy.  Have swelling in your ankles.  Have trouble with your vision. Get help right away if you:  Develop a severe headache or confusion.  Have unusual weakness or numbness.  Feel faint.  Have severe pain in your chest or abdomen.  Vomit repeatedly.  Have trouble breathing. Summary  Hypertension is when the force of blood pumping through your  arteries is too strong. If this condition is not controlled, it may put you at risk for serious complications.  Your personal target blood pressure may vary depending on your medical conditions, your age, and other factors. For most people, a normal blood pressure is less than 120/80.  Hypertension is treated with lifestyle changes, medicines, or a combination of both. Lifestyle changes include losing weight, eating a healthy, low-sodium diet, exercising more, and limiting alcohol. This information is not intended to replace advice given to you by your health care provider. Make sure you discuss any questions you have with your health care provider. Document Released: 05/17/2005 Document Revised: 01/25/2018 Document Reviewed: 01/25/2018 Elsevier Patient Education  [place].   Hypothyroidism  Hypothyroidism is when the thyroid gland does not make enough of certain hormones (it is underactive). The thyroid gland is a small gland located in the lower front part of the neck, just in front of the windpipe (trachea). This gland makes hormones that help control how the body uses food for energy (metabolism) as well as how the heart and brain function. These hormones also play a role in keeping your bones strong. When the thyroid is underactive, it produces too little of the hormones thyroxine (T4) and triiodothyronine (T3). What are the causes? This condition may be caused by:  Hashimoto's disease. This is a disease in which the body's disease-fighting system (immune system) attacks the thyroid gland. This is the most common cause.  Viral infections.  Pregnancy.  Certain medicines.  Birth defects.  Past radiation treatments to the head or neck for cancer.  Past treatment with radioactive iodine.  Past exposure to radiation in the environment.  Past surgical removal of part or all of the thyroid.  Problems with a gland in the center of the brain (pituitary gland).  Lack of  enough iodine in the diet. What increases the risk? You are more likely to develop this condition if:  You are female.  You have a family history of thyroid conditions.  You use a medicine called lithium.  You take medicines that affect the immune system (immunosuppressants). What are the signs or symptoms? Symptoms of this condition include:  Feeling as though you have no energy (lethargy).  Not being able to tolerate cold.  Weight gain that is not explained by a change in diet or exercise habits.  Lack of appetite.  Dry skin.  Coarse hair.  Menstrual irregularity.  Slowing of thought processes.  Constipation.  Sadness or depression. How is this diagnosed? This condition may be diagnosed based on:  Your symptoms, your  medical history, and a physical exam.  Blood tests. You may also have imaging tests, such as an ultrasound or MRI. How is this treated? This condition is treated with medicine that replaces the thyroid hormones that your body does not make. After you begin treatment, it may take several weeks for symptoms to go away. Follow these instructions at home:  Take over-the-counter and prescription medicines only as told by your health care provider.  If you start taking any new medicines, tell your health care provider.  Keep all follow-up visits as told by your health care provider. This is important. ? As your condition improves, your dosage of thyroid hormone medicine may change. ? You will need to have blood tests regularly so that your health care provider can monitor your condition. Contact a health care provider if:  Your symptoms do not get better with treatment.  You are taking thyroid replacement medicine and you: ? Sweat a lot. ? Have tremors. ? Feel anxious. ? Lose weight rapidly. ? Cannot tolerate heat. ? Have emotional swings. ? Have diarrhea. ? Feel weak. Get help right away if you have:  Chest pain.  An irregular heartbeat.   A rapid heartbeat.  Difficulty breathing. Summary  Hypothyroidism is when the thyroid gland does not make enough of certain hormones (it is underactive).  When the thyroid is underactive, it produces too little of the hormones thyroxine (T4) and triiodothyronine (T3).  The most common cause is Hashimoto's disease, a disease in which the body's disease-fighting system (immune system) attacks the thyroid gland. The condition can also be caused by viral infections, medicine, pregnancy, or past radiation treatment to the head or neck.  Symptoms may include weight gain, dry skin, constipation, feeling as though you do not have energy, and not being able to tolerate cold.  This condition is treated with medicine to replace the thyroid hormones that your body does not make. This information is not intended to replace advice given to you by your health care provider. Make sure you discuss any questions you have with your health care provider. Document Released: 05/17/2005 Document Revised: 04/29/2017 Document Reviewed: 04/27/2017 Elsevier Patient Education  2020 [employer].

## 2019-05-05 NOTE — Progress Notes (Signed)
Chief Complaint  Patient presents with  . Follow-up    on blood pressure and TSH, no new concerns.    Patient presents to follow up on her blood pressure and TSH.  She has a h/o elevated BP's, never diagnosed with hypertension.  BP's at home have been running 134-157/73-90. Her monitor was verified as accurate in 10/2016.  She denies headaches, dizziness, chest pain. She admits she has been less active since COVID, since no longer going to the [facility].  She exercises occasionally.  Doesn't have a set routine.  She has had some mildly elevated TSH values in the past.  At her last wellness visit in August, TSH was 5.280.  The year prior it had been 4.010.  She didn't have symptoms, so we elected to repeat in 3 months, along with TPO antibodies.  The antibodies were negative, but TSH further increased.  She had read that excess levels of biotin can affect TSH levels. She takes this for alopecia, which she has been told is hereditary.  She has chronic thinning on the top of her head, which really hasn't changed recently.   Lab Results  Component Value Date   TSH 6.240 (H) 04/30/2019   PMH, PSH, SH reviewed  Outpatient Encounter Medications as of 05/07/2019  Medication Sig Note  . BIOTIN PO Take by mouth. 09/05/2017: Takes 2500mg  daily  . ergocalciferol (VITAMIN D2) 1.25 MG (50000 UT) capsule Take 1 capsule (50,000 Units total) by mouth every 30 (thirty) days.   . Multiple Vitamins-Minerals (CENTRUM ULTRA WOMENS PO) Take 1 tablet by mouth daily.     Marland Kitchen acetaminophen (TYLENOL) 325 MG tablet Take 650 mg by mouth every 6 (six) hours as needed. Reported on 08/25/2015   . hydrochlorothiazide (MICROZIDE) 12.5 MG capsule Take 1 capsule (12.5 mg total) by mouth daily.   Marland Kitchen loratadine (CLARITIN) 10 MG tablet Take 10 mg by mouth daily. Reported on 08/25/2015 04/11/2014: Takes as needed in the spring for allergies  . naproxen sodium (ANAPROX) 220 MG tablet Take 440 mg by mouth as needed.   . valACYclovir  (VALTREX) 500 MG tablet Take 500 mg by mouth daily. 09/05/2017: Uses prn flares   No facility-administered encounter medications on file as of 05/07/2019.    (NOT taking HCTZ prior to today's visit)  Allergies  Allergen Reactions  . Benadryl [Diphenhydramine]     Gives her palpitations  . Penicillins Hives    ROS: no fever, chills, URI symptoms, cough, shortness of breath, chest pain, edema.  No change in energy, skin, bowels, moods, weight.  No GI complaints or other concerns.  See HPI   PHYSICAL EXAM:  BP 130/84   Pulse 68   Temp (!) 96.8 F (36 C) (Tympanic)   Ht 5\' 7"  (1.702 m)   Wt 158 lb 6.4 oz (71.8 kg)   BMI 24.81 kg/m   Wt Readings from Last 3 Encounters:  01/29/19 159 lb (72.1 kg)  09/05/17 161 lb 3.2 oz (73.1 kg)  07/11/17 163 lb 6.4 oz (74.1 kg)   BP Readings from Last 3 Encounters:  01/29/19 (!) 146/70  09/05/17 120/82  07/11/17 118/70   Well-appearing, pleasant female, in good spirits, in no distress HEENT: Significant thinning on the top of her head.  No patchy loss Conjunctiva and sclera are clear, EOMI.  Wearing mask due to COVID-19 pandemic Neck: no lymphadenopathy, thyromegaly or carotid bruit Heart: regular rate and rhythm, no murmur Lungs: clear bilaterally Abdomen: soft, nontender Extremities: no edema, 2+pulses Psych: normal  mood, affect, hygiene and grooming Neuro: alert and oriented, normal gait    ASSESSMENT/PLAN:  Essential hypertension - BP goals reviewed. Start HCTZ 12.5mg , risks/SE reviewed. Cont to monitor at home. Resume regular exercise - Plan: hydrochlorothiazide (MICROZIDE) 12.5 MG capsule  Subclinical hypothyroidism - continue to monitor. Discussed in detail   She wants to stop taking the biotin prior to her next check, just to see if that affects anything.    Start taking HCTZ 12.5mg  once daily. Be sure to try and get some potassium-rich foods in your diet (ie 1/2 banana most days), and stay well hydrated. I don't know  if this dose alone would be enough to get blood pressure to goal (under 130/80), but am fairly confident that this medication along with regular exercise should get you there. Be in touch with any questions, concerns and blood pressure readings. Bring your list to you next visit.

## 2019-05-07 ENCOUNTER — Encounter: Payer: Self-pay | Admitting: Family Medicine

## 2019-05-07 ENCOUNTER — Other Ambulatory Visit: Payer: Self-pay

## 2019-05-07 ENCOUNTER — Other Ambulatory Visit: Payer: Self-pay | Admitting: Family Medicine

## 2019-05-07 ENCOUNTER — Ambulatory Visit (INDEPENDENT_AMBULATORY_CARE_PROVIDER_SITE_OTHER): Payer: Medicare Other | Admitting: Family Medicine

## 2019-05-07 VITALS — BP 130/84 | HR 68 | Temp 96.8°F | Ht 67.0 in | Wt 158.4 lb

## 2019-05-07 DIAGNOSIS — I10 Essential (primary) hypertension: Secondary | ICD-10-CM

## 2019-05-07 DIAGNOSIS — E039 Hypothyroidism, unspecified: Secondary | ICD-10-CM

## 2019-05-07 DIAGNOSIS — E038 Other specified hypothyroidism: Secondary | ICD-10-CM

## 2019-05-07 MED ORDER — HYDROCHLOROTHIAZIDE 12.5 MG PO CAPS: 12.5000 mg | ORAL_CAPSULE | Freq: Every day | ORAL | 1 refills | Status: DC

## 2019-05-28 ENCOUNTER — Other Ambulatory Visit: Payer: Self-pay

## 2019-05-28 ENCOUNTER — Ambulatory Visit (INDEPENDENT_AMBULATORY_CARE_PROVIDER_SITE_OTHER): Payer: Medicare Other | Admitting: Family Medicine

## 2019-05-28 ENCOUNTER — Encounter: Payer: Self-pay | Admitting: Family Medicine

## 2019-05-28 VITALS — BP 150/84 | HR 68 | Temp 96.3°F | Ht 67.0 in | Wt 159.4 lb

## 2019-05-28 DIAGNOSIS — R079 Chest pain, unspecified: Secondary | ICD-10-CM

## 2019-05-28 DIAGNOSIS — Z5181 Encounter for therapeutic drug level monitoring: Secondary | ICD-10-CM

## 2019-05-28 DIAGNOSIS — I10 Essential (primary) hypertension: Secondary | ICD-10-CM | POA: Diagnosis not present

## 2019-05-28 NOTE — Progress Notes (Signed)
Chief Complaint  Patient presents with  . Consult    since starting HCTZ she has been experiencing pressure/sort of like indigestion in her chest, in turn this is causing her not to sleep well as it is worse at night. Also tingling from her knees down through her calves and in her right arm. Also mentions that her bp numbers are going up.     Started HCTZ 12.5mg  on 12/9.  Within the week she noticed some tingling in her legs (from the knees down, bilaterally), as well as a heaviness in her chest.  She felt like she had indigestion.  One day when it was worse, she took Tums, which didn't seem to help much.  She has noticed an irregular heartbeat "fluttering awareness".  Last night was bad--after going to the bathroom she felt tingling in her right arm, and felt irregular heartbeat, like it was beating "extra hard", not so much irregular in rhythm.  Similar thing happened 12/24.  She sits up and it feels better.  She admits she gets anxious when she feels this, which can make it escalate, worries about her heart.  She did some deep breathing, and eventually felt comfortable to be able to lay down and get back to sleep.  Denies any change in diet. She has been trying to have some bananas with peanut butter, and other potassium-rich foods (beans, nuts, spinach).  Had some jerking in her LLE a few nights ago-she has had this in the past. She will wake up at night with warmth/tingling in her calves.  BP yesterday was 161/84, higher than prior to being on the HCTZ.  Repeat was down to 152/81.  At this point, she would like to make sure everything is okay with her heart, and stop the medication and keep an eye on things until her already scheduled 1/11 appointment.  She prefers not to start another BP medication at this time.  PMH, PSH, SH reviewed  Outpatient Encounter Medications as of 05/28/2019  Medication Sig Note  . BIOTIN PO Take by mouth. 09/05/2017: Takes 2500mg  daily  . ergocalciferol (VITAMIN  D2) 1.25 MG (50000 UT) capsule Take 1 capsule (50,000 Units total) by mouth every 30 (thirty) days.   . hydrochlorothiazide (MICROZIDE) 12.5 MG capsule Take 1 capsule (12.5 mg total) by mouth daily. 05/28/2019: Did not take this morning  . Multiple Vitamins-Minerals (CENTRUM ULTRA WOMENS PO) Take 1 tablet by mouth daily.     Marland Kitchen acetaminophen (TYLENOL) 325 MG tablet Take 650 mg by mouth every 6 (six) hours as needed. Reported on 08/25/2015   . loratadine (CLARITIN) 10 MG tablet Take 10 mg by mouth daily. Reported on 08/25/2015 04/11/2014: Takes as needed in the spring for allergies  . naproxen sodium (ANAPROX) 220 MG tablet Take 440 mg by mouth as needed.   . valACYclovir (VALTREX) 500 MG tablet Take 500 mg by mouth daily. 09/05/2017: Uses prn flares   No facility-administered encounter medications on file as of 05/28/2019.   Allergies  Allergen Reactions  . Benadryl [Diphenhydramine]     Gives her palpitations  . Penicillins Hives   ROS: no fever, chills, headaches, dizziness.  Tingling in her arms and legs per HPI, heaviness in chest, and extra hard heartbeats periodically per HPI.  No nausea, vomiting, muscle cramps, bleeding, bruising, rashes or other concerns.  See HPI   PHYSICAL EXAM:  BP (!) 150/84   Pulse 68   Temp (!) 96.3 F (35.7 C) (Other (Comment))   Ht 5'  7" (1.702 m)   Wt 159 lb 6.4 oz (72.3 kg)   BMI 24.97 kg/m   Well-appearing, pleasant, somewhat anxious female, in no distress HEENT: conjunctiva and sclera are clear, EOMI. Wearing mask Neck: no lymphadenopathy, thyromegaly or mass Heart: regular rate and rhythm, no murmur, ectopy Lungs: clear bilaterally Chest: nontender to palpation Abdomen: soft, nontender, no organomegaly or mass Extremities: no edema Psych: slightly anxious (due to heart concerns), otherwise normal mood, affect, hygiene and grooming, normal speech and eye contact Neuro: alert and oriented, normal gait  EKG--NSR, no acute changes.  Compared to  EKG from 07/2013--that one showed 1st degree AVB which is not present today. Otherwise was unchanged  ASSESSMENT/PLAN:  Essential hypertension - SE to HCTZ including chest pressure, tingling, prefers to stop. Prefers to hold off on starting new med until 1/11 visit, and will monitor at home in interim - Plan: Basic Metabolic Panel (BMET), EKG 12-Lead  Medication monitoring encounter - discussed potential electrolyte abnl related to HCTZ, will check for those (even though stopping med, to know if can restart in future, if needed) - Plan: Basic Metabolic Panel (BMET)  Chest pain, unspecified type - Ddx reviewed, consider GERD, cardiac; EKG not concerning today - Plan: EKG 12-Lead   Discussed Ddx of her various complaints. Reassured that EKG looked good, checking electrolytes.  Wants to let me get out of system, monitor BP and return to her already scheduled appt on 1/11 Declines starting another med now Briefly discussed low dose amlodipine as being well tolerated and possibly start as low as 2.5mg .  This may be a well-tolerated, low dose option to consider if BP's remain above goal at her visit next month.

## 2019-05-29 LAB — BASIC METABOLIC PANEL
BUN/Creatinine Ratio: 14 (ref 12–28)
BUN: 11 mg/dL (ref 8–27)
CO2: 25 mmol/L (ref 20–29)
Calcium: 9.8 mg/dL (ref 8.7–10.3)
Chloride: 96 mmol/L (ref 96–106)
Creatinine, Ser: 0.79 mg/dL (ref 0.57–1.00)
GFR calc Af Amer: 87 mL/min/{1.73_m2} (ref >59)
GFR calc non Af Amer: 76 mL/min/{1.73_m2} (ref >59)
Glucose: 87 mg/dL (ref 65–99)
Potassium: 4.2 mmol/L (ref 3.5–5.2)
Sodium: 135 mmol/L (ref 134–144)

## 2019-06-11 ENCOUNTER — Encounter: Payer: Medicare Other | Admitting: Family Medicine

## 2019-06-26 ENCOUNTER — Ambulatory Visit: Payer: BLUE CROSS/BLUE SHIELD

## 2019-07-05 ENCOUNTER — Ambulatory Visit: Payer: Medicare Other | Attending: Internal Medicine

## 2019-07-05 DIAGNOSIS — Z23 Encounter for immunization: Secondary | ICD-10-CM

## 2019-07-05 NOTE — Progress Notes (Signed)
   Covid-19 Vaccination Clinic  Name:  Annette Monroe    MRN: [id number] DOB: Jul 30, 1945  07/05/2019  [name] was observed post Covid-19 immunization for 15 minutes without incidence. She was provided with Vaccine Information Sheet and instruction to access the V-Safe system.   [name] was instructed to call 911 with any severe reactions post vaccine: Marland Kitchen Difficulty breathing  . Swelling of your face and throat  . A fast heartbeat  . A bad rash all over your body  . Dizziness and weakness    Immunizations Administered    Name Date Dose VIS Date Route   Pfizer COVID-19 Vaccine 07/05/2019  8:55 AM 0.3 mL 05/11/2019 Intramuscular   Manufacturer: [place]   Lot: [id number]   [place]: [id number]

## 2019-07-24 NOTE — Progress Notes (Signed)
Chief Complaint  Patient presents with  . Hypertension    med check, follow up on blood pressure. Has been having daily HA's x 3 weeks. Sometimes bad enough that she takes a tylenol. The base of her skull. Can hear a littel crackle sound at the base of her skull when she moves her head. Feels like her equilibruim is off. Has woke from her sleep twice with pain on the top of her head-sharp pain.     Patient presents to follow up on hypertension.  She was started on HCTZ 12.5mg  on 05/09/19.  Within the week she noticed some tingling in her legs (from the knees down, bilaterally), as well as a heaviness in her chest.  She felt like she had indigestion, though Tums didn't help. She had noticed an irregular heartbeat "fluttering awareness". At her last visit she had described a couple of episodes where she felt tingling in her right arm, and felt irregular heartbeat, like it was beating "extra hard", not so much irregular in rhythm. Self-resolved with some deep breathing and sitting up (when it occurred at night).  She had electrolytes checked at last visit, which were normal.  At her last visit (05/28/2019), she wanted to stop the HCTZ, let it get out of her system and "keep an eye on things" until her f/u visit (which was scheduled for just a couple of weeks later, on 06/11/19, but she rescheduled this until today).  We had briefly discussed other potential BP medication to include low dose amlodipine (be she did not want to start new medication at that visit).  That feeling in her chest resolved after stopping the HCTZ.  Her BP's were worse on the med, due to her chest concerns contributing to anxiety. She has been going to the park to walk, or doing walking DVD's at home, exercising  Her BP's have been running 126-156/74-82. Only <130 twice.  Over 140  5 mornings and all 3 afternoons that it was checked, checking daily since 05/30/2019.  Her home monitor was verified as accurate in 05/2019.  She  reports having posterior headaches x 3 weeks.  Pain is across the whole base of her head, associated with neck pain.  Has in the mornings, and all day. Sometimes her head feels "funny" and balance seems a little off.  She wakes up with numbness in her fingers which she attributes to carpal tunnel (used to sleep with braces, which helped). Otherwise denies any radicular pain, numbness or tingling.  Fingers can get numb if cold.  Vitamin D deficiency--on rx dose once monthly as maintenance for the last couple of years.  Level was slightly low on last check  PMH, PSH, SH reviewed  She stopped her biotin (she read it can affect her TSH), and wants her TSH checked again.  She is having more hair issues, seeing more loss (more than just the thinning issues she has had). She is also feeling more tired, unsure if worse sleep related to her head/neck pain could be contributing to that.   Outpatient Encounter Medications as of 07/25/2019  Medication Sig Note  . acetaminophen (TYLENOL) 500 MG tablet Take 500 mg by mouth every 6 (six) hours as needed. 07/25/2019: Last dose Sat 07/21/19  . ergocalciferol (VITAMIN D2) 1.25 MG (50000 UT) capsule Take 1 capsule (50,000 Units total) by mouth every 30 (thirty) days. 07/25/2019: Takes it the 15th of the month  . Multiple Vitamins-Minerals (CENTRUM ULTRA WOMENS PO) Take 1 tablet by mouth daily.     . [  DISCONTINUED] acetaminophen (TYLENOL) 325 MG tablet Take 650 mg by mouth every 6 (six) hours as needed. Reported on 08/25/2015   . BIOTIN PO Take by mouth. 07/25/2019: Stopped taking 2 weeks ago  . loratadine (CLARITIN) 10 MG tablet Take 10 mg by mouth daily. Reported on 08/25/2015 04/11/2014: Takes as needed in the spring for allergies  . naproxen sodium (ANAPROX) 220 MG tablet Take 440 mg by mouth as needed.   . valACYclovir (VALTREX) 500 MG tablet Take 500 mg by mouth daily. 09/05/2017: Uses prn flares   No facility-administered encounter medications on file as of  07/25/2019.    ROS:  No fever, chills, URI symptoms. No dizziness. No further palpitations, tingling or chest pressure.   Headaches per HPI No nausea, vomiting, bowel changes increased fatigue and hair loss as reported above   PHYSICAL EXAM:  BP 140/86   Pulse 68   Temp (!) 96.8 F (36 C) (Other (Comment))   Ht 5\' 7"  (1.702 m)   Wt 159 lb (72.1 kg) Comment: with boots-didn't want to remove (asked)  BMI 24.90 kg/m   (short booty)  148/74 on repeat by MD, RA  Wt Readings from Last 3 Encounters:  07/25/19 159 lb (72.1 kg)  05/28/19 159 lb 6.4 oz (72.3 kg)  05/07/19 158 lb 6.4 oz (71.8 kg)   Well-appearing, pleasant female, in no distress HEENT: conjunctiva and sclera are clear, EOMI. Thinning of hair on the top of her head. Neck: Borderline thyroid size, nontender, no nodules No c-spine tenderness, FROM. Area of discomfort is across base of spine and trapezius muscles, nontender to palpitation on occiput Heart: regular rate and rhythm Lungs: clear bilaterally Extremities: no edema Neuro: alert and oriented, normal strength, DTR's 2+ and symmetric, normal gait.   ASSESSMENT/PLAN  Essential hypertension - remains mildly elevated, treatment recommended. Will hold off in case other meds being started, and HA's may be contributing. low dose amlodipine will be rec  Hypothyroidism, unspecified type - elevated TSH's noted; pt brings to my attention that her biotin may be contributing. Ab's were neg. Now off biotin. Recheck TSH, and start treatment if still up - Plan: TSH  Vitamin D deficiency - recheck, given mildly low on last check and some increased fatigue - Plan: VITAMIN D 25 Hydroxy (Vit-D Deficiency, Fractures)  Neck pain - suspect underlying arthritis or DDD. Shown stretches/exercises. Consider PT. no radicular sx - Plan: DG Cervical Spine Complete  Occipital headache - Plan: DG Cervical Spine Complete   TSH, D c-spine  If TSH 5 or up, try Synthroid  34mcg Discussed how to take Adjust D dosage if needed   prob f/u 6-8 weeks, await labs, etc to schedule. Will need to re-address BP and start meds if remains as they are now, above goal.

## 2019-07-25 ENCOUNTER — Other Ambulatory Visit: Payer: Self-pay

## 2019-07-25 ENCOUNTER — Ambulatory Visit
Admission: RE | Admit: 2019-07-25 | Discharge: 2019-07-25 | Disposition: A | Discharge status: Home / Self Care | Payer: Medicare Other | Source: Ambulatory Visit | Attending: Family Medicine | Admitting: Family Medicine

## 2019-07-25 ENCOUNTER — Ambulatory Visit (INDEPENDENT_AMBULATORY_CARE_PROVIDER_SITE_OTHER): Payer: Medicare Other | Admitting: Family Medicine

## 2019-07-25 ENCOUNTER — Encounter: Payer: Self-pay | Admitting: Family Medicine

## 2019-07-25 VITALS — BP 140/86 | HR 68 | Temp 96.8°F | Ht 67.0 in | Wt 159.0 lb

## 2019-07-25 DIAGNOSIS — M542 Cervicalgia: Secondary | ICD-10-CM

## 2019-07-25 DIAGNOSIS — R519 Headache, unspecified: Secondary | ICD-10-CM

## 2019-07-25 DIAGNOSIS — I10 Essential (primary) hypertension: Secondary | ICD-10-CM | POA: Diagnosis not present

## 2019-07-25 DIAGNOSIS — E559 Vitamin D deficiency, unspecified: Secondary | ICD-10-CM | POA: Diagnosis not present

## 2019-07-25 DIAGNOSIS — E039 Hypothyroidism, unspecified: Secondary | ICD-10-CM

## 2019-07-25 NOTE — Patient Instructions (Signed)
Do the neck stretches twice daily as shown. Go to [facility] for x-rays of your neck ([address])  We will contact you with thyroid and vitamin D results tomorrow.  Consider trying taking 2 Aleve twice daily for up to 10 days.  Take with food, and if it bothers your stomach, take just 1 tablet twice daily.  Physical Therapy might end up being an option.  Let's see what x-rays show and how you do with the Aleve.

## 2019-07-26 ENCOUNTER — Encounter: Payer: Self-pay | Admitting: Family Medicine

## 2019-07-26 LAB — VITAMIN D 25 HYDROXY (VIT D DEFICIENCY, FRACTURES): Vit D, 25-Hydroxy: 31.5 ng/mL (ref 30.0–100.0)

## 2019-07-26 LAB — TSH: TSH: 2.56 u[IU]/mL (ref 0.450–4.500)

## 2019-07-30 ENCOUNTER — Ambulatory Visit: Payer: Medicare Other | Attending: Internal Medicine

## 2019-07-30 DIAGNOSIS — Z23 Encounter for immunization: Secondary | ICD-10-CM | POA: Insufficient documentation

## 2019-07-30 NOTE — Progress Notes (Signed)
done

## 2019-07-30 NOTE — Progress Notes (Signed)
   Covid-19 Vaccination Clinic  Name:  Annette Monroe    MRN: [id number] DOB: Aug 03, 1945  07/30/2019  Annette Monroe was observed post Covid-19 immunization for 15 minutes without incidence. She was provided with Vaccine Information Sheet and instruction to access the V-Safe system.   Annette Monroe was instructed to call 911 with any severe reactions post vaccine: Marland Kitchen Difficulty breathing  . Swelling of your face and throat  . A fast heartbeat  . A bad rash all over your body  . Dizziness and weakness    Immunizations Administered    Name Date Dose VIS Date Route   Pfizer COVID-19 Vaccine 07/30/2019 12:03 PM 0.3 mL 05/11/2019 Intramuscular   Manufacturer: [place]   Lot: [id number]   [place]: [id number]

## 2019-08-06 DIAGNOSIS — L669 Cicatricial alopecia, unspecified: Secondary | ICD-10-CM | POA: Diagnosis not present

## 2019-08-06 DIAGNOSIS — L668 Other cicatricial alopecia: Secondary | ICD-10-CM | POA: Diagnosis not present

## 2019-08-06 DIAGNOSIS — L659 Nonscarring hair loss, unspecified: Secondary | ICD-10-CM | POA: Diagnosis not present

## 2019-08-06 DIAGNOSIS — L811 Chloasma: Secondary | ICD-10-CM | POA: Diagnosis not present

## 2019-08-06 DIAGNOSIS — Z87898 Personal history of other specified conditions: Secondary | ICD-10-CM | POA: Diagnosis not present

## 2019-08-15 DIAGNOSIS — L659 Nonscarring hair loss, unspecified: Secondary | ICD-10-CM | POA: Diagnosis not present

## 2019-08-15 DIAGNOSIS — L811 Chloasma: Secondary | ICD-10-CM | POA: Diagnosis not present

## 2019-09-11 DIAGNOSIS — L669 Cicatricial alopecia, unspecified: Secondary | ICD-10-CM | POA: Insufficient documentation

## 2019-09-11 NOTE — Progress Notes (Signed)
Chief Complaint  Patient presents with  . Hypertension    med check/follow up on blood pressure. Patient has list of bp's with her.    Patient presents for follow-up on her blood pressure. She didn't tolerate HCTZ (heaviness in her chest, and a "fluttering awareness").  At her visit in February she had reported having occipital headaches and neck pain. c-spine films showed: Disc osteophytic disease at C3-C4 and C5-C6. She had been shown stretches, and we recommended a trial of Aleve (2BID for up to 10d); PT was discussed as an option if not improving with these measures. Headaches and pain were felt to contribute to some of her elevated BP's.  She took the Aleve and did the stretches as shown.  She bought a stand for her iPad, so she is looking down much less.  Headache and neck pain has improved.  She continues to try and limit the sodium in her diet.  BP's at home are running 126-134/70-80 in the mornings. Later in the day they range 130-151/74-84. (averaging low to mid 140's in the evenings). Denies headaches, dizziness, chest pain or shortness of breath.  Since her last visit she saw dermatologist at [facility]. Biopsy was performed which revealed scarring alopecia. She as started on clobetasol foam TIW, and 2.5mg  of finasteride daily.  She reports that she stopped the finasteride after 2 weeks due to breast tenderness, fullness/discomfort. She was also started on hydroquinone 4% cream for melasma.   She has learned to accept her hair loss.  She is having trouble losing some weight at her stomach, and is self-conscious about this.  Was asking about Optavia for weight loss   PMH, PSH, SH reviewed  Outpatient Encounter Medications as of 09/12/2019  Medication Sig Note  . BIOTIN PO Take by mouth.   . clobetasol (OLUX) 0.05 % topical foam Apply to scalp 3-4 times weekly   . ergocalciferol (VITAMIN D2) 1.25 MG (50000 UT) capsule Take 1 capsule (50,000 Units total) by mouth every 30 (thirty) days.  07/25/2019: Takes it the 15th of the month  . loratadine (CLARITIN) 10 MG tablet Take 10 mg by mouth daily. Reported on 08/25/2015   . Multiple Vitamins-Minerals (CENTRUM ULTRA WOMENS PO) Take 1 tablet by mouth daily.     Marland Kitchen acetaminophen (TYLENOL) 500 MG tablet Take 500 mg by mouth every 6 (six) hours as needed.   . naproxen sodium (ANAPROX) 220 MG tablet Take 440 mg by mouth as needed.   . valACYclovir (VALTREX) 500 MG tablet Take 500 mg by mouth daily. 09/05/2017: Uses prn flares   No facility-administered encounter medications on file as of 09/12/2019.   Allergies  Allergen Reactions  . Benadryl [Diphenhydramine]     Gives her palpitations  . Finasteride Other (See Comments)    Breast tenderness/fullness  . Penicillins Hives     ROS: no fever, chills, dizziness, chest pain, shortness of breath, URI symptoms, cough. No nausea, vomiting, bowel changes, bleeding, bruising or other concerns. Alopecia stable, no longer having scalp itching. Trouble losing weight from her stomach. See HPI   PHYSICAL EXAM:  BP 130/84   Pulse 72   Temp 98.4 F (36.9 C) (Other (Comment))   Ht 5\' 7"  (1.702 m)   Wt 156 lb 3.2 oz (70.9 kg)   BMI 24.46 kg/m   Wt Readings from Last 3 Encounters:  09/12/19 156 lb 3.2 oz (70.9 kg)  07/25/19 159 lb (72.1 kg)  05/28/19 159 lb 6.4 oz (72.3 kg)    Well-appearing,  pleasant female, in no distress HEENT: conjunctiva and sclera are clear, EOMI. Thinning of hair on the top of her head. Wearing mask. Neck: no lymphadenopathy Heart: regular rate and rhythm Lungs: clear bilaterally Back: no spinal or CVA tenderness, no muscle tenderness Extremities: no edema Neuro: alert and oriented, normal gait.   ASSESSMENT/PLAN:  Essential hypertension - BP's improved.  Morning BP okay, evening borderline. To recheck when elevated. Cont low Na diet, exercise, monitoring BP  Scarring alopecia - under the care of Dr. Leonie Green  BMI 24.0-24.9, adult - Reassured that her  weight is healthy. If she lost a few pounds, still would be healthy, but NOT recommended to start [place]. Discussed toning, yoga as well  F/u as scheduled for CPE in September, sooner prn.

## 2019-09-12 ENCOUNTER — Ambulatory Visit (INDEPENDENT_AMBULATORY_CARE_PROVIDER_SITE_OTHER): Payer: Medicare Other | Admitting: Family Medicine

## 2019-09-12 ENCOUNTER — Encounter: Payer: Self-pay | Admitting: Family Medicine

## 2019-09-12 ENCOUNTER — Other Ambulatory Visit: Payer: Self-pay

## 2019-09-12 VITALS — BP 130/84 | HR 72 | Temp 98.4°F | Ht 67.0 in | Wt 156.2 lb

## 2019-09-12 DIAGNOSIS — L669 Cicatricial alopecia, unspecified: Secondary | ICD-10-CM | POA: Diagnosis not present

## 2019-09-12 DIAGNOSIS — Z6824 Body mass index (BMI) 24.0-24.9, adult: Secondary | ICD-10-CM

## 2019-09-12 DIAGNOSIS — I10 Essential (primary) hypertension: Secondary | ICD-10-CM

## 2019-09-12 NOTE — Patient Instructions (Signed)
Continue a low sodium diet, and getting regular exercise. Periodically monitor your blood pressure.  Your evenings are a little higher than I'd like, but overall are okay. Consider rechecking them if it is high, after relaxing for 5-10 minutes.  Record both values. Bring your list to your visit in September.  Your weight is healthy. Consider yoga for stress reduction as well as core strengthening.

## 2019-10-31 DIAGNOSIS — H1045 Other chronic allergic conjunctivitis: Secondary | ICD-10-CM | POA: Diagnosis not present

## 2019-12-17 ENCOUNTER — Other Ambulatory Visit: Payer: Self-pay | Admitting: Family Medicine

## 2019-12-17 DIAGNOSIS — E559 Vitamin D deficiency, unspecified: Secondary | ICD-10-CM

## 2019-12-17 NOTE — Telephone Encounter (Signed)
Tried to call patient to see if she needs this before Sept appt-could not leave message. Will try again.

## 2019-12-19 ENCOUNTER — Other Ambulatory Visit: Payer: Self-pay | Admitting: Family Medicine

## 2019-12-19 DIAGNOSIS — E559 Vitamin D deficiency, unspecified: Secondary | ICD-10-CM

## 2019-12-19 NOTE — Telephone Encounter (Signed)
Pt has an upcoming appt and will NOT need more than one before her September appt.

## 2019-12-24 DIAGNOSIS — L669 Cicatricial alopecia, unspecified: Secondary | ICD-10-CM | POA: Diagnosis not present

## 2019-12-24 DIAGNOSIS — L658 Other specified nonscarring hair loss: Secondary | ICD-10-CM | POA: Diagnosis not present

## 2019-12-27 DIAGNOSIS — H2513 Age-related nuclear cataract, bilateral: Secondary | ICD-10-CM | POA: Diagnosis not present

## 2019-12-27 DIAGNOSIS — H35033 Hypertensive retinopathy, bilateral: Secondary | ICD-10-CM | POA: Diagnosis not present

## 2019-12-27 DIAGNOSIS — H40013 Open angle with borderline findings, low risk, bilateral: Secondary | ICD-10-CM | POA: Diagnosis not present

## 2019-12-27 DIAGNOSIS — H3561 Retinal hemorrhage, right eye: Secondary | ICD-10-CM | POA: Diagnosis not present

## 2020-01-24 ENCOUNTER — Other Ambulatory Visit: Payer: Self-pay | Admitting: Family Medicine

## 2020-01-24 DIAGNOSIS — E559 Vitamin D deficiency, unspecified: Secondary | ICD-10-CM

## 2020-02-06 NOTE — Patient Instructions (Addendum)
HEALTH MAINTENANCE RECOMMENDATIONS:  It is recommended that you get at least 30 minutes of aerobic exercise at least 5 days/week (for weight loss, you may need as much as 60-90 minutes). This can be any activity that gets your heart rate up. This can be divided in 10-15 minute intervals if needed, but try and build up your endurance at least once a week.  Weight bearing exercise is also recommended twice weekly.  Eat a healthy diet with lots of vegetables, fruits and fiber.  "Colorful" foods have a lot of vitamins (ie green vegetables, tomatoes, red peppers, etc).  Limit sweet tea, regular sodas and alcoholic beverages, all of which has a lot of calories and sugar.  Up to 1 alcoholic drink daily may be beneficial for women (unless trying to lose weight, watch sugars).  Drink a lot of water.  Calcium recommendations are 1200-1500 mg daily (1500 mg for postmenopausal women or women without ovaries), and vitamin D 1000 IU daily.  This should be obtained from diet and/or supplements (vitamins), and calcium should not be taken all at once, but in divided doses.  Monthly self breast exams and yearly mammograms for women over the age of 2 is recommended.  Sunscreen of at least SPF 30 should be used on all sun-exposed parts of the skin when outside between the hours of 10 am and 4 pm (not just when at beach or pool, but even with exercise, golf, tennis, and yard work!)  Use a sunscreen that says "broad spectrum" so it covers both UVA and UVB rays, and make sure to reapply every 1-2 hours.  Remember to change the batteries in your smoke detectors when changing your clock times in the spring and fall. Carbon monoxide detectors are recommended for your home.  Use your seat belt every time you are in a car, and please drive safely and not be distracted with cell phones and texting while driving.   Annette Monroe , Thank you for taking time to come for your Medicare Wellness Visit. I appreciate your ongoing  commitment to your health goals. Please review the following plan we discussed and let me know if I can assist you in the future.    This is a list of the screening recommended for you and due dates:  Health Maintenance  Topic Date Due  . Flu Shot  12/30/2019  . Tetanus Vaccine  02/07/2021  . Mammogram  04/11/2020  . Colon Cancer Screening  03/27/2028  . DEXA scan (bone density measurement)  Completed  . COVID-19 Vaccine  Completed  .  Hepatitis C: One time screening is recommended by Center for Disease Control  (CDC) for  adults born from [birth year] through 1963.   Completed  . Pneumonia vaccines  Completed   You will be due for a tetanus booster next year. You may be eligible for a COVID booster in November (pay attention to the news; currently for [place] they are saying give booster 8 months after the 2nd dose, but things may change).  I recommend getting the new shingles vaccine (Shingrix). You will need to get this from the pharmacy. It is a series of 2 injections, spaced 2 months apart.  Continue yearly mammograms, due again in November.  We discussed restarting enteric-coated aspirin 81mg  daily. In light of your recent hemorrhage in the eye, hold off on starting that until you discuss with your eye doctor at the next visit (end of the year).  We will be setting you up for a  CT calcium score. If this indicates risk for cardiovascular disease, then adding a statin (rosuvastatin).  We will contact you with the results.  Try to limit your fluids after 7pm.  Drink the fluids earlier in the day.  At your convenience, please get [place] copies of your living will and healthcare power of attorney so they can be scanned into your chart.

## 2020-02-06 NOTE — Progress Notes (Signed)
Chief Complaint  Patient presents with  . Medicare Wellness    fasting AWV, no gyn exam-saw Dr.Lowe 03/2019. (I will get records). No concerns.   . Flu Vaccine    wants to come back in October.   Annette Monroe is a 74 y.o. female who presents for annual wellness visit and follow-up on chronic medical conditions.  She has appt to see her GYN.  Elevated blood pressures: She didn't tolerate HCTZ (heaviness in her chest, and a "fluttering awareness").  At her visit in February she had reported having occipital headaches and neck pain, which was felt to contribute to higher blood pressures. She continues to try and limit the sodium in her diet.   BP's at home are running 116-137/68-80, mostly 120's to low 130's/70's. One day saw a little higher when very anxious about her scalp itching. Denies headaches, dizziness, chest pain or shortness of breath. She was noted to have a retinal hemorrhage on the R in July.  4 month f/u was recommended.  Denies changes in vision. Has moderate cataracts.  Occipital headaches/neck pain.  C-spine films showed disc osteophytic disease at C3-C4 and C5-C6. She had taken some Aleve, and did the recommended stretches, she bought a stand for her iPad to help limit looking down, and her headaches and neck pain has improved.  Sometimes only in the morning she has slight occipital discomfort, which she relates to her pillow.  It resolves on its own.  History of vitamin D deficiency: Last level was 31.5 in 07/2019.  She is taking 50,000 IU of vitamin D once a month (on the 15th of the month). She prefers this over daily supplement.  She needs refills.  Hyperlipidemia: Cholesterol has been controlled with diet.LDL in 07/2014 was 149.Improved with diet, LDL 08/2017 was 128. She continues to try and follow a low cholesterol diet. Currently eating 5 eggs/week or less, but more egg whites. Eats oatmeal every other day.  Limits cheese (2% string cheese as a snack), butter. Uses  vinaigrette dressings.  She doesn't eat red meat.  Lab Results  Component Value Date   CHOL 164 01/24/2019   HDL 47 01/24/2019   LDLCALC 90 01/24/2019   TRIG 136 01/24/2019   CHOLHDL 3.5 01/24/2019   She has been noted to have atherosclerosis in the thoracic aorta (seen on CXR 07/2014).   Immunization History  Administered Date(s) Administered  . Fluad Quad(high Dose 65+) 03/05/2019  . Influenza Split 03/01/2011, 02/15/2012  . Influenza Whole 03/28/2007  . Influenza, High Dose Seasonal PF 03/19/2013, 02/28/2014  . Influenza,inj,Quad PF,6+ Mos 03/10/2015, 03/03/2016, 03/02/2017, 03/29/2018  . PFIZER SARS-COV-2 Vaccination 07/05/2019, 07/30/2019  . Pneumococcal Conjugate-13 08/20/2013  . Pneumococcal Polysaccharide-23 08/22/2014  . Td 07/05/1995  . Tdap 02/08/2011  . Zoster 01/04/2013   Last Pap smear: 09/2017, Dr. Corinna Capra, normal. S/p hysterectomy.  Last mammogram: 04/2019 Last colonoscopy: 02/2018 internal hemorrhoids and diverticulosis (Dr. Paulita Fujita). DEXA: 01/2014, normal, done through Dr. Roque Cash office Dentist: twice yearlyand periodontist twice yearly Ophtho: once yearly. Noted to have retinal hemorrhage on R at July visit. Exercise:"I'm honestly not doing very much".  She used to walk with a friend at [facility], until it got too hot. Does some home exercises, but only once a week.  (Prior routine (pre-COVID) included [employer] 3x/week (30 mins treadmill, 30 minutes on other machines including recumbent bike, abs, arms, wt machines). Sometimes does walking DVD's at [facility] the weekends.)  Other doctors caring for patient include: Ophtho: Dr.Weaver Dentist: Dr. Darnelle Maffucci  [facility]: Dr. Corinna Capra Periodontist: Dr. Satira Sark GI: Dr. Talbot Grumbling: Dr. Leonie Green at [facility] ENT: Dr. Redmond Baseman   Depression screen: negative Fall Screen:None in the last year Functional Status screen:notable only for occasional leakage of urine (with sneezing with full bladder). Reports some tinnitus and high  decibel hearing loss Mini-Cog screening:Normal (5) See epic for full screens  End of Life Discussion: Patient hasa living will and medical power of attorney  PMH, PSH, SH and FH were reviewed  Outpatient Encounter Medications as of 02/07/2020  Medication Sig Note  . clobetasol ointment (TEMOVATE) 2.37 % Apply 1 application topically 2 (two) times daily.   . Multiple Vitamins-Minerals (CENTRUM ULTRA WOMENS PO) Take 1 tablet by mouth daily.     . Vitamin D, Ergocalciferol, (DRISDOL) 1.25 MG (50000 UNIT) CAPS capsule TAKE 1 CAPSULE BY MOUTH EVERY 30 DAYS   . acetaminophen (TYLENOL) 500 MG tablet Take 500 mg by mouth every 6 (six) hours as needed. (Patient not taking: Reported on 02/07/2020)   . BIOTIN PO Take by mouth. (Patient not taking: Reported on 02/07/2020) 02/07/2020: Stopped taking it 10 days ago so it wouldn't interfere with thyroid tests if done  . loratadine (CLARITIN) 10 MG tablet Take 10 mg by mouth daily. Reported on 08/25/2015 (Patient not taking: Reported on 02/07/2020)   . naproxen sodium (ANAPROX) 220 MG tablet Take 440 mg by mouth as needed. (Patient not taking: Reported on 02/07/2020)   . valACYclovir (VALTREX) 500 MG tablet Take 500 mg by mouth daily. (Patient not taking: Reported on 02/07/2020) 09/05/2017: Uses prn flares  . [DISCONTINUED] clobetasol (OLUX) 0.05 % topical foam Apply to scalp 3-4 times weekly    No facility-administered encounter medications on file as of 02/07/2020.   Allergies  Allergen Reactions  . Benadryl [Diphenhydramine]     Gives her palpitations  . Finasteride Other (See Comments)    Breast tenderness/fullness  . Penicillins Hives    ROS: The patient denies anorexia, fever, headaches, vision changes, ear pain, sore throat, breast concerns,syncope, dyspnea on exertion, cough, swelling, nausea, vomiting, diarrhea, constipation, abdominal pain, melena, hematochezia, indigestion/heartburn, hematuria, incontinence, dysuria, vaginal bleeding or discharge, odor  or itch, weakness, tremor, suspicious skin lesions, depression, anxiety, abnormal bleeding/bruising, or enlarged lymph nodes.  genital herpes outbreaks, infrequent. Chronic tinnitus, no further vertigo. Seasonal allergies, not flaring currently. Alopecia, at front/top of head. Currently on treatment with steroid creams through Dr. Leonie Green. Has had recent scalp itching, so is using it. Sometimes has urinary frequency at night, has noted she isn't drinking much water during the day, so is drinking fluid later in the day. Occasional posterior headache or neck pain in morning only, short-lived. Sometimes gets aching in her lower legs when she sits for too long.    PHYSICAL EXAM:  BP 130/80   Pulse 68   Ht 5' 6"  (1.676 m)   Wt 153 lb 9.6 oz (69.7 kg)   BMI 24.79 kg/m   Wt Readings from Last 3 Encounters:  02/07/20 153 lb 9.6 oz (69.7 kg)  09/12/19 156 lb 3.2 oz (70.9 kg)  07/25/19 159 lb (72.1 kg)    General Appearance:  Alert, cooperative, no distress, appears stated age   Head:  Normocephalic, without obvious abnormality, atraumatic   Eyes:  Pupils are equal, round, reactive; conjunctiva/corneas clear, EOM's intact; fundi not well visualized today  Ears:  Normal TM's and external ear canals   Nose:  Not examined (wearing mask due to COVID-19 pandemic)   Throat:  Not examined (wearing  mask due to COVID-19 pandemic)  Neck:  Supple, no lymphadenopathy; thyroid: no enlargement/ tenderness/nodules; no carotid bruit or JVD   Back:  Spine nontender, no curvature, ROM normal, no CVA tenderness   Lungs:  Clear to auscultation bilaterally without wheezes, rales or ronchi; respirations unlabored   Chest Wall:  No tenderness or deformity   Heart:  Regular rate and rhythm, S1 and S2 normal, no murmur, rub or gallop   Breast Exam:  Deferred to GYN   Abdomen:  Soft, non-tender, nondistended, normoactive bowel sounds, no masses, no hepatosplenomegaly    Genitalia:  Deferred to GYN      Extremities:  No clubbing, cyanosis or edema.  Pulses:  2+ and symmetric all extremities.  Skin:  Skin color, texture, turgor normal, no rashes or lesions.She has diffuse, significant thinning of hair on top of her head, stable.hyperpigmented scar at R anterior shin. Curvy shaped mole just to the right of thoracic spine, uniform color, unchanged (compared to photo on her phone)  Lymph nodes:  Cervical, supraclavicular, and axillary nodes normal   Neurologic:  CNII-XII intact, normal strength, sensation and gait; reflexes 2+ and symmetric throughout    Psych: Normal mood, affect, hygiene and grooming    ASSESSMENT/PLAN:  Medicare annual wellness visit, subsequent  Vitamin D deficiency - on once monthly 50,000 IU.   - Plan: VITAMIN D 25 Hydroxy (Vit-D Deficiency, Fractures)  Thoracic aorta atherosclerosis ([place]) - recommended statin; pt is hesitant. Check CT Calcium score  - Plan: Lipid panel  Pure hypercholesterolemia - Plan: Lipid panel  Medication monitoring encounter - Plan: VITAMIN D 25 Hydroxy (Vit-D Deficiency, Fractures), CBC with Differential/Platelet, Comprehensive metabolic panel, Lipid panel  Retinal hemorrhage of right eye - noted by ophtho in 11/2019.  Has 4 month f/u scheduled.  No change in vision noted by patient. ASA rec due to atherosclerosis--but to check if ok w/ophtho now - Plan: CBC with Differential/Platelet   ASCVD risk 13.5%  Get records from Dr. Corinna Capra, has appt scheduled.   Cbc, c-met, lipids, D (no need for TSH, normal after stopping biotin, no antibodies)  Vitamin D a little low--change to 2x/month rather than once a month. Refilled.  Discussed monthly self breast exams and yearly mammograms; at least 30 minutes of aerobic activity at least 5 days/week, weight bearing exercise at least 2x/wk; proper sunscreen use reviewed; healthy diet, including goals of calcium and  vitamin D intake and alcohol recommendations (less than or equal to 1 drink/day) reviewed; regular seatbelt use; changing batteries in smoke detectors. Immunization recommendations discussed--Continue yearly flu shots (she declines today, prefers to return in October).Shingrix recommended, risks/side effects reviewed; to get from pharmacy. Poss COVID booster in November.Tdap booster next year. Coloncancer screening UTD.  Full Code, Full Care Asked her to get [place] copies of her living will and healthcare power of attorney.   Medicare Attestation I have personally reviewed: The patient's medical and social history Their use of alcohol, tobacco or illicit drugs Their current medications and supplements The patient's functional ability including ADLs,fall risks, home safety risks, cognitive, and hearing and visual impairment Diet and physical activities Evidence for depression or mood disorders  The patient's weight, height, BMI have been recorded in the chart.  I have made referrals, counseling, and provided education to the patient based on review of the above and I have provided the patient with a written personalized care plan for preventive services.

## 2020-02-07 ENCOUNTER — Encounter: Payer: Self-pay | Admitting: Family Medicine

## 2020-02-07 ENCOUNTER — Ambulatory Visit (INDEPENDENT_AMBULATORY_CARE_PROVIDER_SITE_OTHER): Payer: Medicare Other | Admitting: Family Medicine

## 2020-02-07 ENCOUNTER — Other Ambulatory Visit: Payer: Self-pay

## 2020-02-07 VITALS — BP 130/80 | HR 68 | Ht 66.0 in | Wt 153.6 lb

## 2020-02-07 DIAGNOSIS — E7800 Pure hypercholesterolemia, unspecified: Secondary | ICD-10-CM | POA: Diagnosis not present

## 2020-02-07 DIAGNOSIS — E559 Vitamin D deficiency, unspecified: Secondary | ICD-10-CM | POA: Diagnosis not present

## 2020-02-07 DIAGNOSIS — Z5181 Encounter for therapeutic drug level monitoring: Secondary | ICD-10-CM

## 2020-02-07 DIAGNOSIS — I700 Atherosclerosis of aorta: Secondary | ICD-10-CM

## 2020-02-07 DIAGNOSIS — H3561 Retinal hemorrhage, right eye: Secondary | ICD-10-CM | POA: Diagnosis not present

## 2020-02-07 DIAGNOSIS — Z0000 Encounter for general adult medical examination without abnormal findings: Secondary | ICD-10-CM

## 2020-02-08 LAB — COMPREHENSIVE METABOLIC PANEL
ALT: 15 IU/L (ref 0–32)
AST: 17 IU/L (ref 0–40)
Albumin/Globulin Ratio: 1.8 (ref 1.2–2.2)
Albumin: 4.4 g/dL (ref 3.7–4.7)
Alkaline Phosphatase: 101 IU/L (ref 48–121)
BUN/Creatinine Ratio: 16 (ref 12–28)
BUN: 12 mg/dL (ref 8–27)
Bilirubin Total: 0.5 mg/dL (ref 0.0–1.2)
CO2: 23 mmol/L (ref 20–29)
Calcium: 9.8 mg/dL (ref 8.7–10.3)
Chloride: 103 mmol/L (ref 96–106)
Creatinine, Ser: 0.77 mg/dL (ref 0.57–1.00)
GFR calc Af Amer: 90 mL/min/{1.73_m2} (ref >59)
GFR calc non Af Amer: 78 mL/min/{1.73_m2} (ref >59)
Globulin, Total: 2.4 g/dL (ref 1.5–4.5)
Glucose: 94 mg/dL (ref 65–99)
Potassium: 4.3 mmol/L (ref 3.5–5.2)
Sodium: 138 mmol/L (ref 134–144)
Total Protein: 6.8 g/dL (ref 6.0–8.5)

## 2020-02-08 LAB — CBC WITH DIFFERENTIAL/PLATELET
Basophils Absolute: 0 10*3/uL (ref 0.0–0.2)
Basos: 0 %
EOS (ABSOLUTE): 0.1 10*3/uL (ref 0.0–0.4)
Eos: 1 %
Hematocrit: 40.7 % (ref 34.0–46.6)
Hemoglobin: 14.2 g/dL (ref 11.1–15.9)
Immature Grans (Abs): 0 10*3/uL (ref 0.0–0.1)
Immature Granulocytes: 0 %
Lymphocytes Absolute: 2.8 10*3/uL (ref 0.7–3.1)
Lymphs: 41 %
MCH: 32.2 pg (ref 26.6–33.0)
MCHC: 34.9 g/dL (ref 31.5–35.7)
MCV: 92 fL (ref 79–97)
Monocytes Absolute: 0.4 10*3/uL (ref 0.1–0.9)
Monocytes: 6 %
Neutrophils Absolute: 3.6 10*3/uL (ref 1.4–7.0)
Neutrophils: 52 %
Platelets: 343 10*3/uL (ref 150–450)
RBC: 4.41 x10E6/uL (ref 3.77–5.28)
RDW: 12.2 % (ref 11.7–15.4)
WBC: 6.9 10*3/uL (ref 3.4–10.8)

## 2020-02-08 LAB — LIPID PANEL
Chol/HDL Ratio: 3.6 ratio (ref 0.0–4.4)
Cholesterol, Total: 211 mg/dL — ABNORMAL HIGH (ref 100–199)
HDL: 58 mg/dL (ref >39)
LDL Chol Calc (NIH): 134 mg/dL — ABNORMAL HIGH (ref 0–99)
Triglycerides: 108 mg/dL (ref 0–149)
VLDL Cholesterol Cal: 19 mg/dL (ref 5–40)

## 2020-02-08 LAB — VITAMIN D 25 HYDROXY (VIT D DEFICIENCY, FRACTURES): Vit D, 25-Hydroxy: 29.8 ng/mL — ABNORMAL LOW (ref 30.0–100.0)

## 2020-02-08 MED ORDER — VITAMIN D (ERGOCALCIFEROL) 1.25 MG (50000 UNIT) PO CAPS: 50000.0000 [IU] | ORAL_CAPSULE | ORAL | 1 refills | Status: DC

## 2020-02-22 ENCOUNTER — Ambulatory Visit
Admission: RE | Admit: 2020-02-22 | Discharge: 2020-02-22 | Disposition: A | Discharge status: Home / Self Care | Payer: Medicare Other | Source: Ambulatory Visit | Attending: Family Medicine | Admitting: Family Medicine

## 2020-02-22 DIAGNOSIS — I700 Atherosclerosis of aorta: Secondary | ICD-10-CM

## 2020-02-23 DIAGNOSIS — Z23 Encounter for immunization: Secondary | ICD-10-CM | POA: Diagnosis not present

## 2020-03-12 ENCOUNTER — Other Ambulatory Visit: Payer: Self-pay | Admitting: Family Medicine

## 2020-03-12 DIAGNOSIS — E559 Vitamin D deficiency, unspecified: Secondary | ICD-10-CM

## 2020-03-26 ENCOUNTER — Other Ambulatory Visit (INDEPENDENT_AMBULATORY_CARE_PROVIDER_SITE_OTHER): Payer: Medicare Other

## 2020-03-26 ENCOUNTER — Other Ambulatory Visit: Payer: Self-pay

## 2020-03-26 DIAGNOSIS — Z23 Encounter for immunization: Secondary | ICD-10-CM | POA: Diagnosis not present

## 2020-04-02 NOTE — Progress Notes (Signed)
Chief Complaint  Patient presents with  . Gastroesophageal Reflux    has burning across her stomach and deep burning up in her chest after eating. Has been having to take Mylanta twice daily when she has the symptoms. She chest symptoms are relieved but not the stomach burning.    Over the last week she has been getting burning across the center of her abdomen (at level of umbilicus, when pointed out in a seated position), and "ache" that mostly starts after eating.  She has noted it sometimes even before eating, seemed to last "all day" one day, starting after breakfast, and lasted most of the day.  She is also getting a pressure in the chest area.  She figured it might be reflux, so she tried Mylanta.  This helped with the chest pressure. The abdominal symptoms improved some, but didn't resolve.  The day her symptoms lasted all day, she took Mylanta twice (morning and evening), one of which also had "nighttime" with chamomille, and also one for gas. Symptoms improved, but didn't completely resolve.  She has h/o mild reflux in the past, rarely occurring up into her throat after eating too much.  Sometimes she will cough at night, relieved by throat lozenge and drinking water. This also hasn't happened in a long time.  She has 1 cup of coffee after breakfast, symptoms were no different when she skipped it (still had same discomfort/burning after eating breakfast). Denies changes to diet--no increase in spicy food, no alcohol, no change in caffeine, citrus, tomatoes, peppermint. Denies use of NSAIDs. She had pain related to her COVID booster (HA x 3 days), took Tylenol.  Bowels are regular, moving them daily.  No change in abdominal symptoms related to defecating. Denies mucus or blood in the stool.  She also would like to f/u on treatment recommendations for aortic atherosclerosis and her cholesterol.  CT cardiac scoring done 01/2020: IMPRESSION: No visible coronary artery calcifications. Total  coronary calcium score of 0. Despite a coronary calcium score of 0, there are moderate calcifications within the aortic root and descending thoracic aorta.  Patient was advised: "Given the recent LDL 134 and intermediate risk for cardiovascular events (ASCVD risk), I still highly recommend starting a statin for prevention of cardiovascular disease/events (which include stroke, peripheral vascular disease, not just related to the coronary arteries, which this test looks at), and hoping to get the LDL below 100. Let me know if you will be amenable to trying the rosuvastatin once or twice a week as we discussed."  Lab Results  Component Value Date   CHOL 211 (H) 02/07/2020   HDL 58 02/07/2020   LDLCALC 134 (H) 02/07/2020   TRIG 108 02/07/2020   CHOLHDL 3.6 02/07/2020    She reports being willing to try the Crestor, would prefer not to take it daily.  There was also some confusion with her Vitamin D--she has been taking every 2 weeks (the 1st and 15th of the month), but last bottle had directions for q month. She wanted to know how much she should be taking, and for how long.   PMH, PSH, SH reviewed  Outpatient Encounter Medications as of 04/03/2020  Medication Sig Note  . BIOTIN PO Take by mouth.    . Multiple Vitamins-Minerals (CENTRUM ULTRA WOMENS PO) Take 1 tablet by mouth daily.     . Vitamin D, Ergocalciferol, (DRISDOL) 1.25 MG (50000 UNIT) CAPS capsule TAKE 1 CAPSULE BY MOUTH EVERY 30 DAYS 04/03/2020: Taking every 2 weeks as  directed (this is not the correct rx bc she requested old rx RF in error).  . [DISCONTINUED] clobetasol ointment (TEMOVATE) 0.05 % Apply to scalp three times weekly   . acetaminophen (TYLENOL) 500 MG tablet Take 500 mg by mouth every 6 (six) hours as needed. (Patient not taking: Reported on 02/07/2020)   . clobetasol ointment (TEMOVATE) 2.39 % Apply 1 application topically 2 (two) times daily. (Patient not taking: Reported on 04/03/2020)   . loratadine (CLARITIN)  10 MG tablet Take 10 mg by mouth daily. Reported on 08/25/2015 (Patient not taking: Reported on 02/07/2020)   . naproxen sodium (ANAPROX) 220 MG tablet Take 440 mg by mouth as needed. (Patient not taking: Reported on 02/07/2020)   . valACYclovir (VALTREX) 500 MG tablet Take 500 mg by mouth daily. (Patient not taking: Reported on 02/07/2020) 09/05/2017: Uses prn flares  . [DISCONTINUED] PRED FORTE 1 % ophthalmic suspension 1 drop 4 (four) times daily.    No facility-administered encounter medications on file as of 04/03/2020.   Allergies  Allergen Reactions  . Benadryl [Diphenhydramine]     Gives her palpitations  . Finasteride Other (See Comments)    Breast tenderness/fullness  . Penicillins Hives    ROS: no fever, chills, dizziness, exertional chest pain, palpitations, URI symptoms, shortness of breath. Chest/abdominal burning per HPI.  No bowel changes. No urinary complaints, changes in mood, or other concerns. See HPI.   PHYSICAL EXAM:  BP 128/80   Pulse 80   Ht 5\' 6"  (1.676 m)   Wt 154 lb (69.9 kg)   BMI 24.86 kg/m   Wt Readings from Last 3 Encounters:  04/03/20 154 lb (69.9 kg)  02/07/20 153 lb 9.6 oz (69.7 kg)  09/12/19 156 lb 3.2 oz (70.9 kg)   Pleasant, well-appearing female, in good spirits, in no distress HEENT: conjunctiva and sclera are clear, EOMI, wearing mask Neck: no lymphadenopathy or mass Heart: regular rate and rhythm Lungs: clear bilaterally Back: no spinal or CVA tenderness. Abdomen: +Mild epigastric tenderness. No organomegaly or mass, negative Murphy. nontender elsewhere. No masses Chest wall: nontender. Extremities: no edema Psych: normal mood, affect, hygiene and grooming Neuro: alert and oriented, normal gait.  ASSESSMENT/PLAN:  Gastroesophageal reflux disease, unspecified whether esophagitis present - 2 wk trial of prilosec OTC. Reflux precautions/diet reviewed. PPI vs H2 blocker (pepcid) reviewed, if prolonged treatment is needed  Thoracic aorta  atherosclerosis ([place]) - willing to try statin. Start crestor 10mg  once a week x 1-2 wks, and if no SE, increase to 2x/wk. CoQ10 to be added if SE  Vitamin D deficiency - prev slightly low on once monthly 50K IU; continue rx q 2 weeks.   GERD precautions reviewed in detail Omeprazole x 2 weeks. If not helping, call for rx 40mg  dose. If ongoing issues, can use H2 blockers long-term, if needed Consider EGD if worsening  Counseled in detail re: aortic atherosclerosis. Willing to start Crestor--will take 2x/wk (can start weekly x 1-2 wks).  Recheck D and lipids, LFT's in 3 months. (lab visit)  I spent 47 minutes dedicated to the care of this patient, including pre-visit review of records, face to face time, post-visit ordering of testing, prescriptions, and documentation.

## 2020-04-03 ENCOUNTER — Ambulatory Visit (INDEPENDENT_AMBULATORY_CARE_PROVIDER_SITE_OTHER): Payer: Medicare Other | Admitting: Family Medicine

## 2020-04-03 ENCOUNTER — Other Ambulatory Visit: Payer: Self-pay

## 2020-04-03 ENCOUNTER — Encounter: Payer: Self-pay | Admitting: Family Medicine

## 2020-04-03 VITALS — BP 128/80 | HR 80 | Ht 66.0 in | Wt 154.0 lb

## 2020-04-03 DIAGNOSIS — E7800 Pure hypercholesterolemia, unspecified: Secondary | ICD-10-CM | POA: Diagnosis not present

## 2020-04-03 DIAGNOSIS — K219 Gastro-esophageal reflux disease without esophagitis: Secondary | ICD-10-CM

## 2020-04-03 DIAGNOSIS — Z5181 Encounter for therapeutic drug level monitoring: Secondary | ICD-10-CM | POA: Diagnosis not present

## 2020-04-03 DIAGNOSIS — E559 Vitamin D deficiency, unspecified: Secondary | ICD-10-CM | POA: Diagnosis not present

## 2020-04-03 DIAGNOSIS — I700 Atherosclerosis of aorta: Secondary | ICD-10-CM

## 2020-04-03 MED ORDER — ROSUVASTATIN CALCIUM 10 MG PO TABS: 10.0000 mg | ORAL_TABLET | ORAL | 0 refills | Status: DC

## 2020-04-03 NOTE — Patient Instructions (Addendum)
Check with your pharmacy regarding the vitamin D prescription. There was one sent 9/10 for #6 pills, with instructions to take it every 2 weeks. There was a refill on this, so you should be okay to continue taking it twice weekly for a total of 6 months.  We should check your level again at that time.  Use Prilosec OTC (20mg  omeprazole) once daily before breakfast for 2 weeks. After that, see if your symptoms return.  If they never completely resolve, we can switch to a higher dose (prescription).  If symptoms recur, you may need to either stay on the omeprazole longer, vs switching to Pepcid (famotidine) either once or twice daily, depending on what works for you (20 mg).  If your symptoms are worsening, and not improving, we may need to send you to the gastroenterologist for endoscopy.  We discussed starting Crestor (rosuvastatin) once a week for 1-2 weeks until you feel like you're not having any side effects, then increase to 2x/week (Mon/Thurs).  If you do get any muscle soreness, try adding coenzyme Q10 once daily.   Food Choices for Gastroesophageal Reflux Disease, Adult When you have gastroesophageal reflux disease (GERD), the foods you eat and your eating habits are very important. Choosing the right foods can help ease the discomfort of GERD. Consider working with a diet and nutrition specialist (dietitian) to help you make healthy food choices. What general guidelines should I follow?  Eating plan  Choose healthy foods low in fat, such as fruits, vegetables, whole grains, low-fat dairy products, and lean meat, fish, and poultry.  Eat frequent, small meals instead of three large meals each day. Eat your meals slowly, in a relaxed setting. Avoid bending over or lying down until 2-3 hours after eating.  Limit high-fat foods such as fatty meats or fried foods.  Limit your intake of oils, butter, and shortening to less than 8 teaspoons each day.  Avoid the following: ? Foods that  cause symptoms. These may be different for different people. Keep a food diary to keep track of foods that cause symptoms. ? Alcohol. ? Drinking large amounts of liquid with meals. ? Eating meals during the 2-3 hours before bed.  Cook foods using methods other than frying. This may include baking, grilling, or broiling. Lifestyle  Maintain a healthy weight. Ask your health care provider what weight is healthy for you. If you need to lose weight, work with your health care provider to do so safely.  Exercise for at least 30 minutes on 5 or more days each week, or as told by your health care provider.  Avoid wearing clothes that fit tightly around your waist and chest.  Do not use any products that contain nicotine or tobacco, such as cigarettes and e-cigarettes. If you need help quitting, ask your health care provider.  Sleep with the head of your bed raised. Use a wedge under the mattress or blocks under the bed frame to raise the head of the bed. What foods are not recommended? The items listed may not be a complete list. Talk with your dietitian about what dietary choices are best for you. Grains Pastries or quick breads with added fat. [place] toast. Vegetables Deep fried vegetables. [place] fries. Any vegetables prepared with added fat. Any vegetables that cause symptoms. For some people this may include tomatoes and tomato products, chili peppers, onions and garlic, and horseradish. Fruits Any fruits prepared with added fat. Any fruits that cause symptoms. For some people this may include  citrus fruits, such as oranges, grapefruit, pineapple, and lemons. Meats and other protein foods High-fat meats, such as fatty beef or pork, hot dogs, ribs, ham, sausage, salami and bacon. Fried meat or protein, including fried fish and fried chicken. Nuts and nut butters. Dairy Whole milk and chocolate milk. Sour cream. Cream. Ice cream. Cream cheese. Milk shakes. Beverages Coffee and tea, with or  without caffeine. Carbonated beverages. Sodas. Energy drinks. Fruit juice made with acidic fruits (such as orange or grapefruit). Tomato juice. Alcoholic drinks. Fats and oils Butter. Margarine. Shortening. Ghee. Sweets and desserts Chocolate and cocoa. Donuts. Seasoning and other foods Pepper. Peppermint and spearmint. Any condiments, herbs, or seasonings that cause symptoms. For some people, this may include curry, hot sauce, or vinegar-based salad dressings. Summary  When you have gastroesophageal reflux disease (GERD), food and lifestyle choices are very important to help ease the discomfort of GERD.  Eat frequent, small meals instead of three large meals each day. Eat your meals slowly, in a relaxed setting. Avoid bending over or lying down until 2-3 hours after eating.  Limit high-fat foods such as fatty meat or fried foods. This information is not intended to replace advice given to you by your health care provider. Make sure you discuss any questions you have with your health care provider. Document Revised: 09/07/2018 Document Reviewed: 05/18/2016 Elsevier Patient Education  [place].

## 2020-04-04 ENCOUNTER — Other Ambulatory Visit: Payer: Self-pay | Admitting: Family Medicine

## 2020-04-04 DIAGNOSIS — E7800 Pure hypercholesterolemia, unspecified: Secondary | ICD-10-CM

## 2020-04-04 DIAGNOSIS — I700 Atherosclerosis of aorta: Secondary | ICD-10-CM

## 2020-04-17 LAB — HM MAMMOGRAPHY

## 2020-04-21 ENCOUNTER — Encounter: Payer: Self-pay | Admitting: Family Medicine

## 2020-04-21 DIAGNOSIS — Z6824 Body mass index (BMI) 24.0-24.9, adult: Secondary | ICD-10-CM | POA: Diagnosis not present

## 2020-04-21 DIAGNOSIS — Z124 Encounter for screening for malignant neoplasm of cervix: Secondary | ICD-10-CM | POA: Diagnosis not present

## 2020-04-21 DIAGNOSIS — A609 Anogenital herpesviral infection, unspecified: Secondary | ICD-10-CM | POA: Diagnosis not present

## 2020-04-29 DIAGNOSIS — H3561 Retinal hemorrhage, right eye: Secondary | ICD-10-CM | POA: Diagnosis not present

## 2020-04-29 DIAGNOSIS — H35033 Hypertensive retinopathy, bilateral: Secondary | ICD-10-CM | POA: Diagnosis not present

## 2020-04-29 DIAGNOSIS — H40013 Open angle with borderline findings, low risk, bilateral: Secondary | ICD-10-CM | POA: Diagnosis not present

## 2020-04-30 ENCOUNTER — Encounter: Payer: Self-pay | Admitting: Family Medicine

## 2020-06-09 ENCOUNTER — Other Ambulatory Visit: Payer: Self-pay

## 2020-06-09 ENCOUNTER — Ambulatory Visit (INDEPENDENT_AMBULATORY_CARE_PROVIDER_SITE_OTHER): Payer: Medicare Other | Admitting: Family Medicine

## 2020-06-09 ENCOUNTER — Encounter: Payer: Self-pay | Admitting: Family Medicine

## 2020-06-09 VITALS — BP 130/82 | HR 64 | Ht 66.0 in | Wt 155.2 lb

## 2020-06-09 DIAGNOSIS — N644 Mastodynia: Secondary | ICD-10-CM | POA: Diagnosis not present

## 2020-06-09 NOTE — Patient Instructions (Addendum)
We are going to get you set up for a diagnostic mammogram at [facility].   Fibrocystic Breast Changes  Fibrocystic breast changes are changes that can make your breasts swollen or painful. These changes happen when there is scar-like tissue (fibrous tissue) in the breasts or tiny sacs of fluid (cysts) form in the breast. This is a common condition. It does not mean that you have cancer. It usually happens because of hormone changes during a monthly period. What are the causes? The exact cause of this condition is not known. However, you are more likely to have it:  If you have high levels of female hormones.  If your mother had the same condition (inherited). What are the signs or symptoms? Symptoms of this condition include:  Tenderness, swelling, mild discomfort, or pain.  Rope-like tissue that can be felt when touching the breast.  Lumps in one or both breasts.  Changes in breast size. Breasts may get larger before your period and smaller after your period.  Discharge from the nipple. How is this treated? In many cases, there is no treatment for this condition. In some cases, you may need treatment, including:  Taking medicines.  Avoiding caffeine.  Reducing the amount of sugar and fat in your diet. You may also have:  A procedure to remove fluid from a cyst.  Surgery to remove a cyst that is large or tender or does not go away.  Medicines that may lower female hormones in the body. Follow these instructions at home: Self care Check your breasts after every monthly period. If you do not have monthly periods, check your breasts on the first day of every month. Check for:  Soreness.  New swelling or puffiness.  A change in breast size.  A change in a lump that was already there. General instructions  Take over-the-counter and prescription medicines only as told by your doctor.  Wear a support or sports bra that fits well. Wear this support especially when you are  exercising.  If told by your doctor, avoid or have less caffeine, fat, and sugar in what you eat and drink.  Keep all follow-up visits as told by your doctor. This is important. Contact a doctor if:  You have fluid coming from your nipple, especially if the fluid has blood in it.  You have new lumps or bumps in your breast.  Your breast gets puffy, red, and painful.  You have changes in how your breast looks.  Your nipple looks flat or it sinks into your breast. Get help right away if:  Your breast turns red, and the redness is spreading. Summary  Fibrocystic breast changes are changes that can make your breasts swollen or painful.  This condition can happen when you have hormone changes during your monthly period.  Check your breasts after every monthly period. If you do not have monthly periods, check your breasts on the first day of every month. This information is not intended to replace advice given to you by your health care provider. Make sure you discuss any questions you have with your health care provider. Document Revised: 04/30/2019 Document Reviewed: 04/30/2019 Elsevier Patient Education  2021 [employer].

## 2020-06-09 NOTE — Progress Notes (Signed)
Chief Complaint  Patient presents with  . Breast Pain    Left breast pain x 3 weeks. No discharge. Initially was pain, now more of an ache/discomfort. Just had mammo @ [facility] in Nov.    3 weeks ago she noted sharp pain in the left breast.  She needed to take naproxen to help with the pain.  She wondered if she could have bumped it, but didn't see any bruising.  She doesn't feel any lumps.  Naproxen helped with the discomfort during the day.  No longer getting the sharp pains, but has an ongoing ache/discomfort, sometimes also goes to the under part of her upper arm.  Over the past week, has a constant "discomfot", ache, no longer painful. No nipple drainage/discharge. Both breasts felt fuller a couple of weeks ago, this has improved. Only had the pain/discomfort on the left.  Mammogram 11/18 at [facility] was normal.  No change in medications. No change in diet--caffeine, chocolate.  PMH, PSH, SH reviewed  Outpatient Encounter Medications as of 06/09/2020  Medication Sig Note  . BIOTIN PO Take by mouth.    . clobetasol ointment (TEMOVATE) 7.61 % Apply 1 application topically 2 (two) times daily.   . Multiple Vitamins-Minerals (CENTRUM ULTRA WOMENS PO) Take 1 tablet by mouth daily.   . rosuvastatin (CRESTOR) 10 MG tablet Take 1 tablet (10 mg total) by mouth 2 (two) times a week.   . Vitamin D, Ergocalciferol, (DRISDOL) 1.25 MG (50000 UNIT) CAPS capsule TAKE 1 CAPSULE BY MOUTH EVERY 30 DAYS 04/03/2020: Taking every 2 weeks as directed (this is not the correct rx bc she requested old rx RF in error).  Marland Kitchen acetaminophen (TYLENOL) 500 MG tablet Take 500 mg by mouth every 6 (six) hours as needed. (Patient not taking: No sig reported)   . loratadine (CLARITIN) 10 MG tablet Take 10 mg by mouth daily. Reported on 08/25/2015 (Patient not taking: No sig reported)   . naproxen sodium (ANAPROX) 220 MG tablet Take 440 mg by mouth as needed. (Patient not taking: No sig reported)   . valACYclovir (VALTREX) 500 MG  tablet Take 500 mg by mouth daily. (Patient not taking: No sig reported) 09/05/2017: Uses prn flares   No facility-administered encounter medications on file as of 06/09/2020.   Allergies  Allergen Reactions  . Benadryl [Diphenhydramine]     Gives her palpitations  . Finasteride Other (See Comments)    Breast tenderness/fullness  . Penicillins Hives   ROS: no fever, chills, URI symptoms, nipple discharge, breast masses, just tenderness per HPI.   Reflux is much better, not totally gone. Denies abdominal pain, nausea, vomiting. Moods are good. See HPI   PHYSICAL EXAM:  BP 130/82   Pulse 64   Ht 5\' 6"  (1.676 m)   Wt 155 lb 3.2 oz (70.4 kg)   BMI 25.05 kg/m   Pleasant, well-appearing female, in no distress Breast exam: Fibroglandular changes are a little more prominent in the L UOQ and inferiorly, compared to the right breast. These areas were mildly tender on palpation. No axillary lymphadenopathy or masses. No nipple inversion or drainage.   ASSESSMENT/PLAN:  Breast pain, left - suspect fibroglandular/fibrocystic changes, but given that it is unilateral, should investigate with diagnostic mammo   Diagnostic mammogram left breast at [facility]

## 2020-06-27 ENCOUNTER — Encounter: Payer: Self-pay | Admitting: Family Medicine

## 2020-07-02 NOTE — Progress Notes (Signed)
Chief Complaint  Patient presents with  . Abdominal Pain    Burning and dull ache    Patient was seen in November with epigastric pain. She was diagnosed with GERD. GERD precautions were reviewed in detail. She was treated with Omeprazole 20mg  x 2 weeks and chest symptoms resolved.  1/10 visit she reported reflux wasn't gone, but was much better. She hasn't taken Aleve since last visit (she switched to tylenol per my recommendation, taking for breast pain, which resolved).  Patient is now having some burning in her stomach just slightly above the umbilicus.  Described as a dull ache or burning. No longer having any discomfort into her chest. Sometimes this occurs after eating.  Other times it can occur on an empty stomach, or even after brushing her teeth. She tried antacids when the discomfort was up in her chest, but not with this discomfort. Once she drank a glass of milk, which helped for a while.  Can't identify any trigger. Cut out coffee x 3 weeks (fast through church), no effect. Bowels are normal.  Sometimes when the burning is bad she feels like she needs to go to the bathroom (urgency), sometimes bowels are softer.  Denies loose stools. No blood or mucus in the stool. She gets very mild nausea sometimes with the burning sensation.   She was started on Crestor in November for atherosclerosis. She is taking Crestor 10mg  twice a week, and tolerating it. She is unsure if many of her complaints could be related to the Crestor. She was very hesitant to start it, and is very hesitant to continue it. She thinks it could have caused her breast pain, wonders if causing her abdominal discomfort or urinary complaints (see below), worries about her liver.  The breast pain resolved, despite her continuing on the statin. She denies myalgias. She is due for recheck of lipids/LFT's today.  Vitamin D deficiency:  Last level was 29.8 in September 2021. She normally takes 50,000 IU once a month, but  her dose was increased to every 2 weeks (the 1st and 15th of the month) in September.  This is being rechecked today.  She is reporting urinary frequency at night, sometimes up 4x/night. Doesn't notice frequency during the day.  She cut back on water after dinner, didn't help. Bladder isn't full Slight sensitivity in the vaginal area when she goes. She denies any vaginal discharge or rash.  PMH, PSH, SH reviewed  Outpatient Encounter Medications as of 07/03/2020  Medication Sig Note  . BIOTIN PO Take by mouth.    . clobetasol ointment (TEMOVATE) 6.81 % Apply 1 application topically 2 (two) times daily.   . Multiple Vitamins-Minerals (CENTRUM ULTRA WOMENS PO) Take 1 tablet by mouth daily.   . Vitamin D, Ergocalciferol, (DRISDOL) 1.25 MG (50000 UNIT) CAPS capsule TAKE 1 CAPSULE BY MOUTH EVERY 30 DAYS 04/03/2020: Taking every 2 weeks as directed (this is not the correct rx bc she requested old rx RF in error).  . [DISCONTINUED] rosuvastatin (CRESTOR) 10 MG tablet Take 1 tablet (10 mg total) by mouth 2 (two) times a week.   Marland Kitchen acetaminophen (TYLENOL) 500 MG tablet Take 500 mg by mouth every 6 (six) hours as needed. (Patient not taking: Reported on 07/03/2020)   . loratadine (CLARITIN) 10 MG tablet Take 10 mg by mouth daily. Reported on 08/25/2015 (Patient not taking: No sig reported)   . naproxen sodium (ANAPROX) 220 MG tablet Take 440 mg by mouth as needed. (Patient not taking: No sig  reported) 07/03/2020: None in the last 3 weeks (last taken for breast pain)  . valACYclovir (VALTREX) 500 MG tablet Take 500 mg by mouth daily. (Patient not taking: No sig reported) 09/05/2017: Uses prn flares   No facility-administered encounter medications on file as of 07/03/2020.   Allergies  Allergen Reactions  . Benadryl [Diphenhydramine]     Gives her palpitations  . Finasteride Other (See Comments)    Breast tenderness/fullness  . Penicillins Hives   ROS: no fever, chills, URI symptoms, headaches, dizziness,  chest pain, shortness of breath. No nausea (mild, with burning), vomiting, diarrhea.  Upper abdominal burning and urinary frequency, per HPI.  Breast pain resolved. Moods are good. No myalgias or arthralgias.   PHYSICAL EXAM:  BP (!) 158/80   Pulse 70   Temp (!) 96.6 F (35.9 C)   Wt 151 lb 6.4 oz (68.7 kg)   SpO2 99%   BMI 24.44 kg/m   Wt Readings from Last 3 Encounters:  07/03/20 151 lb 6.4 oz (68.7 kg)  06/09/20 155 lb 3.2 oz (70.4 kg)  04/03/20 154 lb (69.9 kg)   150/86 on repeat by MD (pt states high because she didn't sleep well)  Anxious appearing female (very worried about the crestor contributing to her complaints) She is alert and oriented HEENT: conjunctiva and sclera are clear, EOMI, wearing mask Neck: No lymphadenopathy, thyromegaly or mass Heart: regular rate and rhythm Lungs: clear bilaterally Back: no spinal or CVA tenderness Abdomen: Mild epigastric discomfort, in mid-lower epigastrium. No organomegaly or mass, no rebound or guarding.  Aorta not widened. No suprapubic tenderness Extremities: no edema Neuro: alert and oriented, normal gait, strength  Urine dip normal  ASSESSMENT/PLAN:  Gastroesophageal reflux disease without esophagitis - epigastric burning likely recurrent GERD. Repeat 2 wk course of omeprazole, diet/precautions reviewed. Consider H2 blocker longterm, if needed  Medication monitoring encounter - Plan: Hepatic function panel, VITAMIN D 25 Hydroxy (Vit-D Deficiency, Fractures), Lipid panel  Vitamin D deficiency - recheck level (has been on q2wk 50K) - Plan: VITAMIN D 25 Hydroxy (Vit-D Deficiency, Fractures)  Pure hypercholesterolemia - counseled re: statin, NOT having SE, and liver tests being done today. Reassured. Can do short trial off, but rec taking it longterm (1-2x/wk) - Plan: Lipid panel  Urinary frequency - Plan: POCT Urinalysis DIP (Proadvantage Device)  Thoracic aorta atherosclerosis (HCC) - counseled at length regarding  statins, risks, encouraged her to continue (even if she takes a 2-4wk break).   Blood pressure elevated without history of HTN - anxiety contributing. Encouraged her to monitor elsewhere, and f/u if persistently elevated   Take another 2 week course of Prilosec OTC 20mg  once daily. If you don't notice any improvement after a few days, you can double up and call for a 40mg  prescription. If your symptoms aren't improving, if you develop black or bloody stool, worsening abdominal pain, not able to eat much, feeling full quickly, weight loss, worsening nausea, or other symptoms, please contact your GI for a sooner appointment (may need to be with another provider).  Limit your fluids after dinner (as you have been). Continue to limit caffeine. We are checking your urine for infection. If none is present, you may want to discuss urinary symptoms with your gynecologist (can be related to postmenopausal issues). If you have frequency during the day as well, with leakage, it may be overactive bladder, and there are medications to help.

## 2020-07-03 ENCOUNTER — Ambulatory Visit (INDEPENDENT_AMBULATORY_CARE_PROVIDER_SITE_OTHER): Payer: Medicare Other | Admitting: Family Medicine

## 2020-07-03 ENCOUNTER — Encounter: Payer: Self-pay | Admitting: Family Medicine

## 2020-07-03 ENCOUNTER — Other Ambulatory Visit: Payer: Medicare Other

## 2020-07-03 ENCOUNTER — Other Ambulatory Visit: Payer: Self-pay

## 2020-07-03 VITALS — BP 158/80 | HR 70 | Temp 96.6°F | Wt 151.4 lb

## 2020-07-03 DIAGNOSIS — I700 Atherosclerosis of aorta: Secondary | ICD-10-CM | POA: Diagnosis not present

## 2020-07-03 DIAGNOSIS — R350 Frequency of micturition: Secondary | ICD-10-CM | POA: Diagnosis not present

## 2020-07-03 DIAGNOSIS — K219 Gastro-esophageal reflux disease without esophagitis: Secondary | ICD-10-CM

## 2020-07-03 DIAGNOSIS — R030 Elevated blood-pressure reading, without diagnosis of hypertension: Secondary | ICD-10-CM | POA: Diagnosis not present

## 2020-07-03 DIAGNOSIS — E7800 Pure hypercholesterolemia, unspecified: Secondary | ICD-10-CM | POA: Diagnosis not present

## 2020-07-03 DIAGNOSIS — E559 Vitamin D deficiency, unspecified: Secondary | ICD-10-CM

## 2020-07-03 DIAGNOSIS — Z5181 Encounter for therapeutic drug level monitoring: Secondary | ICD-10-CM

## 2020-07-03 LAB — POCT URINALYSIS DIP (PROADVANTAGE DEVICE)
Bilirubin, UA: NEGATIVE
Blood, UA: NEGATIVE
Glucose, UA: NEGATIVE mg/dL
Ketones, POC UA: NEGATIVE mg/dL
Leukocytes, UA: NEGATIVE
Nitrite, UA: NEGATIVE
Protein Ur, POC: NEGATIVE mg/dL
Specific Gravity, Urine: 1.015
Urobilinogen, Ur: 0.2
pH, UA: 6.5 (ref 5.0–8.0)

## 2020-07-03 NOTE — Patient Instructions (Signed)
  Take another 2 week course of Prilosec OTC 20mg  once daily. If you don't notice any improvement after a few days, you can double up and call for a 40mg  prescription. If your symptoms aren't improving, if you develop black or bloody stool, worsening abdominal pain, not able to eat much, feeling full quickly, weight loss, worsening nausea, or other symptoms, please contact your GI for a sooner appointment (may need to be with another provider).  Limit your fluids after dinner (as you have been). Continue to limit caffeine. We are checking your urine for infection. If none is present, you may want to discuss urinary symptoms with your gynecologist (can be related to postmenopausal issues). If you have frequency during the day as well, with leakage, it may be overactive bladder, and there are medications to help.

## 2020-07-04 ENCOUNTER — Other Ambulatory Visit: Payer: Self-pay | Admitting: Family Medicine

## 2020-07-04 DIAGNOSIS — I700 Atherosclerosis of aorta: Secondary | ICD-10-CM

## 2020-07-04 DIAGNOSIS — E7800 Pure hypercholesterolemia, unspecified: Secondary | ICD-10-CM

## 2020-07-04 LAB — LIPID PANEL
Chol/HDL Ratio: 2.3 ratio (ref 0.0–4.4)
Cholesterol, Total: 136 mg/dL (ref 100–199)
HDL: 59 mg/dL (ref >39)
LDL Chol Calc (NIH): 60 mg/dL (ref 0–99)
Triglycerides: 91 mg/dL (ref 0–149)
VLDL Cholesterol Cal: 17 mg/dL (ref 5–40)

## 2020-07-04 LAB — HEPATIC FUNCTION PANEL
ALT: 17 IU/L (ref 0–32)
AST: 19 IU/L (ref 0–40)
Albumin: 4.1 g/dL (ref 3.7–4.7)
Alkaline Phosphatase: 100 IU/L (ref 44–121)
Bilirubin Total: 0.7 mg/dL (ref 0.0–1.2)
Bilirubin, Direct: 0.2 mg/dL (ref 0.00–0.40)
Total Protein: 6.6 g/dL (ref 6.0–8.5)

## 2020-07-04 LAB — VITAMIN D 25 HYDROXY (VIT D DEFICIENCY, FRACTURES): Vit D, 25-Hydroxy: 45.9 ng/mL (ref 30.0–100.0)

## 2020-07-16 DIAGNOSIS — R1033 Periumbilical pain: Secondary | ICD-10-CM | POA: Diagnosis not present

## 2020-07-16 DIAGNOSIS — K219 Gastro-esophageal reflux disease without esophagitis: Secondary | ICD-10-CM | POA: Diagnosis not present

## 2020-07-18 ENCOUNTER — Other Ambulatory Visit: Payer: Self-pay | Admitting: Physician Assistant

## 2020-07-18 DIAGNOSIS — R1033 Periumbilical pain: Secondary | ICD-10-CM

## 2020-07-31 ENCOUNTER — Other Ambulatory Visit: Payer: Medicare Other

## 2020-08-04 ENCOUNTER — Other Ambulatory Visit: Payer: Self-pay

## 2020-08-04 ENCOUNTER — Other Ambulatory Visit: Payer: Medicare Other

## 2020-08-04 ENCOUNTER — Ambulatory Visit
Admission: RE | Admit: 2020-08-04 | Discharge: 2020-08-04 | Disposition: A | Discharge status: Home / Self Care | Payer: Medicare Other | Source: Ambulatory Visit | Attending: Physician Assistant | Admitting: Physician Assistant

## 2020-08-04 DIAGNOSIS — K5730 Diverticulosis of large intestine without perforation or abscess without bleeding: Secondary | ICD-10-CM | POA: Diagnosis not present

## 2020-08-04 DIAGNOSIS — I700 Atherosclerosis of aorta: Secondary | ICD-10-CM | POA: Diagnosis not present

## 2020-08-04 DIAGNOSIS — Z90710 Acquired absence of both cervix and uterus: Secondary | ICD-10-CM | POA: Diagnosis not present

## 2020-08-04 DIAGNOSIS — R1033 Periumbilical pain: Secondary | ICD-10-CM

## 2020-08-04 MED ORDER — IOPAMIDOL (ISOVUE-300) INJECTION 61%
100.0000 mL | Freq: Once | INTRAVENOUS | Status: AC | PRN
  Administered 2020-08-04: 100 mL via INTRAVENOUS

## 2020-08-13 DIAGNOSIS — K2970 Gastritis, unspecified, without bleeding: Secondary | ICD-10-CM | POA: Diagnosis not present

## 2020-08-13 DIAGNOSIS — R1033 Periumbilical pain: Secondary | ICD-10-CM | POA: Diagnosis not present

## 2020-09-24 ENCOUNTER — Other Ambulatory Visit: Payer: Self-pay | Admitting: Family Medicine

## 2020-09-24 DIAGNOSIS — E7800 Pure hypercholesterolemia, unspecified: Secondary | ICD-10-CM

## 2020-09-24 DIAGNOSIS — E559 Vitamin D deficiency, unspecified: Secondary | ICD-10-CM

## 2020-09-24 DIAGNOSIS — I700 Atherosclerosis of aorta: Secondary | ICD-10-CM

## 2020-10-07 ENCOUNTER — Ambulatory Visit (INDEPENDENT_AMBULATORY_CARE_PROVIDER_SITE_OTHER): Payer: Medicare Other

## 2020-10-07 DIAGNOSIS — Z23 Encounter for immunization: Secondary | ICD-10-CM

## 2020-10-13 DIAGNOSIS — R1033 Periumbilical pain: Secondary | ICD-10-CM | POA: Diagnosis not present

## 2020-10-13 DIAGNOSIS — R1013 Epigastric pain: Secondary | ICD-10-CM | POA: Diagnosis not present

## 2020-11-07 ENCOUNTER — Other Ambulatory Visit: Payer: Self-pay | Admitting: Physician Assistant

## 2020-11-07 DIAGNOSIS — R1033 Periumbilical pain: Secondary | ICD-10-CM

## 2020-11-07 DIAGNOSIS — R1011 Right upper quadrant pain: Secondary | ICD-10-CM

## 2020-11-21 ENCOUNTER — Other Ambulatory Visit: Payer: Self-pay | Admitting: Physician Assistant

## 2020-11-21 ENCOUNTER — Ambulatory Visit
Admission: RE | Admit: 2020-11-21 | Discharge: 2020-11-21 | Disposition: A | Discharge status: Home / Self Care | Payer: Medicare Other | Source: Ambulatory Visit | Attending: Physician Assistant | Admitting: Physician Assistant

## 2020-11-21 DIAGNOSIS — R1033 Periumbilical pain: Secondary | ICD-10-CM

## 2020-11-21 DIAGNOSIS — K2289 Other specified disease of esophagus: Secondary | ICD-10-CM | POA: Diagnosis not present

## 2020-11-21 DIAGNOSIS — R1011 Right upper quadrant pain: Secondary | ICD-10-CM

## 2020-11-24 ENCOUNTER — Other Ambulatory Visit: Payer: Medicare Other

## 2020-12-29 DIAGNOSIS — H35033 Hypertensive retinopathy, bilateral: Secondary | ICD-10-CM | POA: Diagnosis not present

## 2020-12-29 DIAGNOSIS — H04123 Dry eye syndrome of bilateral lacrimal glands: Secondary | ICD-10-CM | POA: Diagnosis not present

## 2020-12-29 DIAGNOSIS — H2513 Age-related nuclear cataract, bilateral: Secondary | ICD-10-CM | POA: Diagnosis not present

## 2020-12-29 DIAGNOSIS — H40013 Open angle with borderline findings, low risk, bilateral: Secondary | ICD-10-CM | POA: Diagnosis not present

## 2021-01-07 DIAGNOSIS — K2930 Chronic superficial gastritis without bleeding: Secondary | ICD-10-CM | POA: Diagnosis not present

## 2021-01-07 DIAGNOSIS — R1013 Epigastric pain: Secondary | ICD-10-CM | POA: Diagnosis not present

## 2021-01-07 DIAGNOSIS — R1033 Periumbilical pain: Secondary | ICD-10-CM | POA: Diagnosis not present

## 2021-01-07 DIAGNOSIS — Z9889 Other specified postprocedural states: Secondary | ICD-10-CM

## 2021-01-07 HISTORY — DX: Other specified postprocedural states: Z98.890

## 2021-01-12 DIAGNOSIS — K2930 Chronic superficial gastritis without bleeding: Secondary | ICD-10-CM | POA: Diagnosis not present

## 2021-01-26 ENCOUNTER — Encounter: Payer: Self-pay | Admitting: *Deleted

## 2021-01-26 DIAGNOSIS — Z1211 Encounter for screening for malignant neoplasm of colon: Secondary | ICD-10-CM | POA: Diagnosis not present

## 2021-01-26 DIAGNOSIS — R1013 Epigastric pain: Secondary | ICD-10-CM | POA: Diagnosis not present

## 2021-02-11 NOTE — Progress Notes (Signed)
Chief Complaint  Patient presents with   Medicare Wellness    Fasting AWV/CPE no pap, also sees eye doctor yearly. Has some questions [phone] she need to see cardiologist and has some other questions as well. Will take HD flu today and I will call when we get new covid vaccine. She will get Tdap at pharmacy as well as shingrix.     Annette Monroe is a 75 y.o. female who presents for annual wellness visit and follow-up on chronic medical conditions. She has many questions regarding atherosclerosis. She is under the care of GYN.  GERD:  She had been seen here for worsening GERD in 03/2020 and 07/2020. She followed up with [facility] for this, had negative upper GI in June, and unremarkable CT of abdomen in 07/2020 (colonic diverticulosis, aortic atherosclerosis noted). She reports being given a PPI (?pantoprazole) daily x 2 weeks).  She underwent EGD in 12/2020 which showed gastritis, biopsy was negative for H.pylori.The severity and frequency of burning in her stomach had improved some even prior to the endoscopy.  She still has some burning, but much better than Nov through June. It is no longer unbearable.  Elevated blood pressures: She didn't tolerate HCTZ in the past (due to heaviness in her chest, and a "fluttering awareness").  She continues to try and limit the sodium in her diet.  BP's are checked periodically.  BP is always fine in the mornings (recalls 128/75 this past weekend). In the afternoon, it may be up to 150.  BP was up to 180 when she went for EGD, admits she was anxious. She recalls that BP's were high at GI visits. Denies headaches, dizziness, chest pain or shortness of breath.  H/o occipital headaches/neck pain.  C-spine films showed disc osteophytic disease at C3-C4 and C5-C6. She had taken some Aleve, and did the recommended stretches, she bought a stand for her iPad to help limit looking down, and her headaches and neck pain improved.  Her morning occipital  discomfort improved after getting a new pillow.  History of vitamin D deficiency: Level was slightly low at 29.8 in 01/2020 when taking 50,000 IU of vitamin D once a month (on the 15th of the month). This was changed to taking it every 2 weeks. Recheck in 07/2020 was 45.9. She switched to once a month through the summer months. She would like this rechecked today.   Hyperlipidemia/atherosclerosis.  She was started on Crestor in November 2021 for atherosclerosis (thoracic aorta, seen on CXR 07/2014) . ASCVD risk was 13.5%. Coronary calcium 01/2020 was 0, but calcifications were noted in aortic root and descending thoracic aorta. She is taking Crestor '10mg'$  twice a week, and tolerating it. She denies myalgias. She continues to try and follow a low cholesterol diet (doesn't eat red meat, uses vinaigrette dressings, uses 2% string cheese as a snack, eating more egg whites).  Recheck of lipids after starting Crestor was good: Lab Results  Component Value Date   CHOL 136 07/03/2020   HDL 59 07/03/2020   LDLCALC 60 07/03/2020   TRIG 91 07/03/2020   CHOLHDL 2.3 07/03/2020    Immunization History  Administered Date(s) Administered   Influenza Split 03/01/2011, 02/15/2012, 03/05/2019   Influenza Whole 03/28/2007   Influenza, High Dose Seasonal PF 03/19/2013, 02/28/2014   Influenza,inj,Quad PF,6+ Mos 03/10/2015, 03/03/2016, 03/02/2017, 03/29/2018, 03/26/2020   PFIZER Comirnaty(Gray Top)Covid-19 Tri-Sucrose Vaccine 10/07/2020   PFIZER(Purple Top)SARS-COV-2 Vaccination 07/05/2019, 07/30/2019, 02/23/2020   Pneumococcal Conjugate-13 08/20/2013   Pneumococcal Polysaccharide-23 08/22/2014  Td 07/05/1995   Tdap 02/08/2011   Zoster, Live 01/04/2013   Last Pap smear: 09/2017, Dr. Corinna Capra, normal. S/p hysterectomy. Sees GYN yearly Last mammogram: 03/2020, L breast US normal 05/2020 (done for breast pain) Last colonoscopy: 02/2018 internal hemorrhoids and diverticulosis (Dr. Paulita Fujita). DEXA: 01/2014, normal, done  through Dr. Roque Cash office Dentist: twice yearly and periodontist twice yearly Ophtho: once yearly.  Exercise: "not very good". Didn't walk much when too hot.  Has DVD's, but hasn't been good about doing them.  Hasn't been to the gym since [place].     Patient Care Team: Rita Ohara, MD as PCP - General (Family Medicine) Hortencia Pilar, MD as Consulting Physician (Surgery) Dr. Mathis Fare, DDS as Consulting Physician (Dentistry) Louretta Shorten, MD as Consulting Physician (Obstetrics and Gynecology) Royston Cowper, DDS as Consulting Physician (Dentistry) Arta Silence, MD as Consulting Physician (Gastroenterology) Loney Loh, MD as Consulting Physician (Dermatology) Melida Quitter, MD as Consulting Physician (Otolaryngology)   Depression Screening: [place] Office Visit from 02/12/2021 in [place]  PHQ-2 Total Score 0       Falls screen:  Fall Risk  02/12/2021 02/07/2020 04/25/2019 01/29/2019 09/05/2017  Falls in the past year? 0 0 0 0 No  Comment - - Emmi Telephone Survey: data to providers prior to load - -  Number falls in past yr: 0 - - - -  Injury with Fall? 0 - - - -  Risk for fall due to : No Fall Risks - - - -  Follow up Falls evaluation completed - - - -    Functional Status Survey: Is the patient deaf or have difficulty hearing?: Yes (ringing in ears, thinks it may be getting worse and lots of ear wax. Thinks she should see ENT and get it cleaned out.) Does the patient have difficulty seeing, even when wearing glasses/contacts?: No Does the patient have difficulty concentrating, remembering, or making decisions?: No Does the patient have difficulty walking or climbing stairs?: No Does the patient have difficulty dressing or bathing?: No Does the patient have difficulty doing errands alone such as visiting a doctor's office or shopping?: No  Mini-Cog Scoring: 5    End of Life Discussion:  Patient has a living will and medical power of attorney.  We do not have copies  PMH, PSH, SH and FH were reviewed  Outpatient Encounter Medications as of 02/12/2021  Medication Sig Note   BIOTIN PO Take by mouth.     clobetasol ointment (TEMOVATE) [id number] % Apply 1 application topically 2 (two) times daily.    Multiple Vitamins-Minerals (CENTRUM ULTRA WOMENS PO) Take 1 tablet by mouth daily.    rosuvastatin (CRESTOR) 10 MG tablet TAKE 1 TABLET(10 MG) BY MOUTH 2 TIMES A WEEK    Vitamin D, Ergocalciferol, (DRISDOL) 1.25 MG (50000 UNIT) CAPS capsule TAKE 1 CAPSULE BY MOUTH EVERY 14 DAYS (Patient taking differently: Take 50,000 Units by mouth every 30 (thirty) days.)    acetaminophen (TYLENOL) 500 MG tablet Take 500 mg by mouth every 6 (six) hours as needed. (Patient not taking: No sig reported)    loratadine (CLARITIN) 10 MG tablet Take 10 mg by mouth daily. Reported on 08/25/2015 (Patient not taking: No sig reported)    naproxen sodium (ANAPROX) 220 MG tablet Take 440 mg by mouth as needed. (Patient not taking: No sig reported)    valACYclovir (VALTREX) 500 MG tablet Take 500 mg by mouth daily. (Patient not taking: No sig reported) 09/05/2017: Uses prn flares  No facility-administered encounter medications on file as of 02/12/2021.   Allergies  Allergen Reactions   Benadryl [Diphenhydramine]     Gives her palpitations   Finasteride Other (See Comments)    Breast tenderness/fullness   Penicillins Hives     ROS: The patient denies anorexia, fever,  headaches, vision changes, ear pain, sore throat, breast concerns,syncope, dyspnea on exertion, cough, swelling, nausea, vomiting, diarrhea, constipation, abdominal pain, melena, hematochezia, indigestion/heartburn, hematuria, incontinence, dysuria, vaginal bleeding or discharge, odor or itch, weakness, tremor, suspicious skin lesions, depression, anxiety, abnormal bleeding/bruising, or enlarged lymph nodes.   genital herpes outbreaks, infrequent. Chronic tinnitus, no vertigo. Feels like she has some  wax. Seasonal allergies, not flaring currently. Alopecia, at front/top of head. Under care of Dr. Leonie Green, though has not seen in the last year. It is getting worse. Tried a weave, didn't like.  Is thinking about getting a wig. Skin tag below bra line occasionally gets irritated.    PHYSICAL EXAM:  BP 140/84   Pulse 72   Ht '5\' 7"'$  (1.702 m)   Wt 148 lb (67.1 kg)   BMI 23.18 kg/m   150/80 on repeat by MD  Wt Readings from Last 3 Encounters:  02/12/21 148 lb (67.1 kg)  07/03/20 151 lb 6.4 oz (68.7 kg)  06/09/20 155 lb 3.2 oz (70.4 kg)    General Appearance:    Alert, cooperative, no distress, appears stated age  Head:    Normocephalic, without obvious abnormality, atraumatic     Eyes:    Pupils are equal, round, reactive; conjunctiva/corneas clear, EOM's intact; fundi not well visualized today  Ears:    Normal TM's and external ear canals. No significant cerumen noted     Nose:    Not examined (wearing mask due to COVID-19 pandemic)     Throat:    Not examined (wearing mask due to COVID-19 pandemic)   Neck:    Supple, no lymphadenopathy; thyroid: no enlargement/ tenderness/nodules; no carotid bruit or JVD     Back:    Spine nontender, no curvature, ROM normal, no CVA tenderness   Lungs:    Clear to auscultation bilaterally without wheezes, rales or ronchi; respirations unlabored     Chest Wall:    No tenderness or deformity     Heart:    Regular rate and rhythm, S1 and S2 normal, no murmur, rub or gallop     Breast Exam:    Deferred to GYN     Abdomen:    Soft, non-tender, nondistended, normoactive bowel sounds, no masses, no hepatosplenomegaly     Genitalia:    Deferred to GYN            Extremities:    No clubbing, cyanosis or edema.  Pulses:    2+ and symmetric all extremities.  Skin:    Skin color, texture, turgor normal, no rashes or lesions. She has diffuse, significant thinning of hair on top of her head. Scalp is normal (no inflammation, flaking). Curvy shaped mole  just to the right of thoracic spine, uniform color, unchanged. Very small skin tag under L breast, not currently irritated.  Pt in gown for waist up exam, wearing pants, legs not examined.   Lymph nodes:    Cervical, supraclavicular, and axillary nodes normal     Neurologic:    Normal strength, sensation and gait; reflexes 2+ and symmetric throughout  Psych:   Normal mood, affect, hygiene and grooming   ASSESSMENT/PLAN:  Medicare annual wellness visit, subsequent  Pure hypercholesterolemia - well controlled on twice weekly Crestor, cont - Plan: Lipid panel  Thoracic aorta atherosclerosis ([place]) - Reviewed her coronary calcium score, CT, lack of symptoms, how statins help. No need to see cardiologist, cont statin  Gastritis without bleeding, unspecified chronicity, unspecified gastritis type - improved.  Under the care of GI  Blood pressure elevated without history of HTN - Pt to monitor BP regularly, record and f/u in 6-8wks. Low Na diet.  Resume regular exercise routine. May need meds if remains above goal (not diuretic) - Plan: Comprehensive metabolic panel  Need for influenza vaccination - Plan: Flu Vaccine QUAD High Dose(Fluad)  Vitamin D deficiency - recheck level per pt request.  Likely will recommend 2x/month Oct thru March, once a month April thru Sept - Plan: VITAMIN D 25 Hydroxy (Vit-D Deficiency, Fractures)  Hair loss - Gradually worse, not much benefit from dermatologist. Thinking about getting wig. Counseled - Plan: TSH  Medication monitoring encounter - Plan: Comprehensive metabolic panel, Lipid panel, CBC with Differential/Platelet, VITAMIN D 25 Hydroxy (Vit-D Deficiency, Fractures), TSH  Checking thyroid--some increase in hot flashes and worsening hair loss. On Biotin which has raised TSH in past   Discussed monthly self breast exams and yearly mammograms; at least 30 minutes of aerobic activity at least 5 days/week, weight bearing exercise at least  2x/wk; proper sunscreen use reviewed; healthy diet, including goals of calcium and vitamin D intake and alcohol recommendations (less than or equal to 1 drink/day) reviewed; regular seatbelt use; changing batteries in smoke detectors. Immunization recommendations discussed-- Continue yearly flu shots, given today. Shingrix recommended, risks/side effects reviewed; to get from pharmacy. Updated COVID booster recommended, to wait 2 weeks from today's vaccine. Tdap booster due now, to get from pharmacy. Discussed need to separate vaccines by 2 weeks. Colon cancer screening UTD.   MOST form reviewed, updated. Full Code, Full Care Asked her to get [place] copies of her living will and healthcare power of attorney.   Medicare Attestation I have personally reviewed: The patient's medical and social history Their use of alcohol, tobacco or illicit drugs Their current medications and supplements The patient's functional ability including ADLs,fall risks, home safety risks, cognitive, and hearing and visual impairment Diet and physical activities Evidence for depression or mood disorders  The patient's weight, height, BMI have been recorded in the chart.  I have made referrals, counseling, and provided education to the patient based on review of the above and I have provided the patient with a written personalized care plan for preventive services.

## 2021-02-11 NOTE — Patient Instructions (Addendum)
HEALTH MAINTENANCE RECOMMENDATIONS:  It is recommended that you get at least 30 minutes of aerobic exercise at least 5 days/week (for weight loss, you may need as much as 60-90 minutes). This can be any activity that gets your heart rate up. This can be divided in 10-15 minute intervals if needed, but try and build up your endurance at least once a week.  Weight bearing exercise is also recommended twice weekly.  Eat a healthy diet with lots of vegetables, fruits and fiber.  "Colorful" foods have a lot of vitamins (ie green vegetables, tomatoes, red peppers, etc).  Limit sweet tea, regular sodas and alcoholic beverages, all of which has a lot of calories and sugar.  Up to 1 alcoholic drink daily may be beneficial for women (unless trying to lose weight, watch sugars).  Drink a lot of water.  Calcium recommendations are 1200-1500 mg daily (1500 mg for postmenopausal women or women without ovaries), and vitamin D 1000 IU daily.  This should be obtained from diet and/or supplements (vitamins), and calcium should not be taken all at once, but in divided doses.  Monthly self breast exams and yearly mammograms for women over the age of 67 is recommended.  Sunscreen of at least SPF 30 should be used on all sun-exposed parts of the skin when outside between the hours of 10 am and 4 pm (not just when at beach or pool, but even with exercise, golf, tennis, and yard work!)  Use a sunscreen that says "broad spectrum" so it covers both UVA and UVB rays, and make sure to reapply every 1-2 hours.  Remember to change the batteries in your smoke detectors when changing your clock times in the spring and fall. Carbon monoxide detectors are recommended for your home.  Use your seat belt every time you are in a car, and please drive safely and not be distracted with cell phones and texting while driving.   Annette Monroe , Thank you for taking time to come for your Medicare Wellness Visit. I appreciate your ongoing  commitment to your health goals. Please review the following plan we discussed and let me know if I can assist you in the future.   This is a list of the screening recommended for you and due dates:  Health Maintenance  Topic Date Due   Zoster (Shingles) Vaccine (1 of 2) Never done   Flu Shot  12/29/2020   Tetanus Vaccine  02/07/2021   Mammogram  06/26/2022   Colon Cancer Screening  03/27/2028   DEXA scan (bone density measurement)  Completed   COVID-19 Vaccine  Completed   Hepatitis C Screening: USPSTF Recommendation to screen - Ages 20-79 yo.  Completed   Pneumonia vaccines  Completed   HPV Vaccine  Aged Out   You are due for another tetanus vaccine (TdaP).  You need to get this from the pharmacy.  The updated COVID booster is recommended.  You can either get this the same day as your flu shot, or separated by 2 weeks.  I recommend getting the new shingles vaccine (Shingrix). Since you have Medicare, you will need to get this from the pharmacy, as it is covered by Part D. This is a series of 2 injections, spaced 2 months apart.   This should be separated from other vaccines by at least 2 weeks.  At your convenience, please get [place] copies of your living will and healthcare power of attorney so that we can get them scanned into your medical  chart.  Be sure to let GI know that/if you are having ongoing symptoms (you don't need to wait 3 months until your visit).  Please monitor your blood pressure twice daily a few times/week regularly, record on the paper provided, and send [place] the list. If your initial blood pressure is high, please relax and repeat 5 minutes later. Record all values. If your blood pressures are mostly all above goal, we will treat.  If they are all over the place, and it is more difficult to decide what to do, we discussed options of 24 hour ambulatory blood pressure monitoring and/or echocardiogram (looking for stiffness, thickened muscles, or evidence of high blood  pressure that should be treated.)  We will let you know if you should increase the Vitamin D back to twice a month through the end of March based on your test results.  Please make a routine for your exercise--put it on your calendar and check it off as you do it.  This will help you be accountable.

## 2021-02-12 ENCOUNTER — Other Ambulatory Visit: Payer: Self-pay

## 2021-02-12 ENCOUNTER — Ambulatory Visit (INDEPENDENT_AMBULATORY_CARE_PROVIDER_SITE_OTHER): Payer: Medicare Other | Admitting: Family Medicine

## 2021-02-12 ENCOUNTER — Encounter: Payer: Self-pay | Admitting: Family Medicine

## 2021-02-12 VITALS — BP 140/84 | HR 72 | Ht 67.0 in | Wt 148.0 lb

## 2021-02-12 DIAGNOSIS — E559 Vitamin D deficiency, unspecified: Secondary | ICD-10-CM

## 2021-02-12 DIAGNOSIS — Z0000 Encounter for general adult medical examination without abnormal findings: Secondary | ICD-10-CM

## 2021-02-12 DIAGNOSIS — L659 Nonscarring hair loss, unspecified: Secondary | ICD-10-CM | POA: Diagnosis not present

## 2021-02-12 DIAGNOSIS — Z5181 Encounter for therapeutic drug level monitoring: Secondary | ICD-10-CM

## 2021-02-12 DIAGNOSIS — K2970 Gastritis, unspecified, without bleeding: Secondary | ICD-10-CM | POA: Diagnosis not present

## 2021-02-12 DIAGNOSIS — E7800 Pure hypercholesterolemia, unspecified: Secondary | ICD-10-CM

## 2021-02-12 DIAGNOSIS — K219 Gastro-esophageal reflux disease without esophagitis: Secondary | ICD-10-CM

## 2021-02-12 DIAGNOSIS — Z23 Encounter for immunization: Secondary | ICD-10-CM

## 2021-02-12 DIAGNOSIS — R030 Elevated blood-pressure reading, without diagnosis of hypertension: Secondary | ICD-10-CM | POA: Diagnosis not present

## 2021-02-12 DIAGNOSIS — I700 Atherosclerosis of aorta: Secondary | ICD-10-CM | POA: Diagnosis not present

## 2021-02-13 LAB — CBC WITH DIFFERENTIAL/PLATELET
Basophils Absolute: 0 10*3/uL (ref 0.0–0.2)
Basos: 1 %
EOS (ABSOLUTE): 0.1 10*3/uL (ref 0.0–0.4)
Eos: 1 %
Hematocrit: 41.4 % (ref 34.0–46.6)
Hemoglobin: 13.9 g/dL (ref 11.1–15.9)
Immature Grans (Abs): 0 10*3/uL (ref 0.0–0.1)
Immature Granulocytes: 0 %
Lymphocytes Absolute: 2.3 10*3/uL (ref 0.7–3.1)
Lymphs: 38 %
MCH: 31.8 pg (ref 26.6–33.0)
MCHC: 33.6 g/dL (ref 31.5–35.7)
MCV: 95 fL (ref 79–97)
Monocytes Absolute: 0.4 10*3/uL (ref 0.1–0.9)
Monocytes: 6 %
Neutrophils Absolute: 3.3 10*3/uL (ref 1.4–7.0)
Neutrophils: 54 %
Platelets: 323 10*3/uL (ref 150–450)
RBC: 4.37 x10E6/uL (ref 3.77–5.28)
RDW: 12.2 % (ref 11.7–15.4)
WBC: 6 10*3/uL (ref 3.4–10.8)

## 2021-02-13 LAB — LIPID PANEL
Chol/HDL Ratio: 2.5 ratio (ref 0.0–4.4)
Cholesterol, Total: 144 mg/dL (ref 100–199)
HDL: 58 mg/dL (ref >39)
LDL Chol Calc (NIH): 72 mg/dL (ref 0–99)
Triglycerides: 69 mg/dL (ref 0–149)
VLDL Cholesterol Cal: 14 mg/dL (ref 5–40)

## 2021-02-13 LAB — COMPREHENSIVE METABOLIC PANEL
ALT: 16 IU/L (ref 0–32)
AST: 22 IU/L (ref 0–40)
Albumin/Globulin Ratio: 1.9 (ref 1.2–2.2)
Albumin: 4.2 g/dL (ref 3.7–4.7)
Alkaline Phosphatase: 103 IU/L (ref 44–121)
BUN/Creatinine Ratio: 15 (ref 12–28)
BUN: 12 mg/dL (ref 8–27)
Bilirubin Total: 0.5 mg/dL (ref 0.0–1.2)
CO2: 24 mmol/L (ref 20–29)
Calcium: 9.4 mg/dL (ref 8.7–10.3)
Chloride: 104 mmol/L (ref 96–106)
Creatinine, Ser: 0.81 mg/dL (ref 0.57–1.00)
Globulin, Total: 2.2 g/dL (ref 1.5–4.5)
Glucose: 96 mg/dL (ref 65–99)
Potassium: 4.3 mmol/L (ref 3.5–5.2)
Sodium: 140 mmol/L (ref 134–144)
Total Protein: 6.4 g/dL (ref 6.0–8.5)
eGFR: 77 mL/min/{1.73_m2} (ref >59)

## 2021-02-13 LAB — TSH: TSH: 3.12 u[IU]/mL (ref 0.450–4.500)

## 2021-02-13 LAB — VITAMIN D 25 HYDROXY (VIT D DEFICIENCY, FRACTURES): Vit D, 25-Hydroxy: 41.5 ng/mL (ref 30.0–100.0)

## 2021-03-09 ENCOUNTER — Other Ambulatory Visit: Payer: Self-pay | Admitting: Family Medicine

## 2021-03-09 ENCOUNTER — Ambulatory Visit (INDEPENDENT_AMBULATORY_CARE_PROVIDER_SITE_OTHER): Payer: Medicare Other

## 2021-03-09 ENCOUNTER — Other Ambulatory Visit: Payer: Self-pay

## 2021-03-09 DIAGNOSIS — E7800 Pure hypercholesterolemia, unspecified: Secondary | ICD-10-CM

## 2021-03-09 DIAGNOSIS — I700 Atherosclerosis of aorta: Secondary | ICD-10-CM

## 2021-03-09 DIAGNOSIS — Z23 Encounter for immunization: Secondary | ICD-10-CM | POA: Diagnosis not present

## 2021-04-02 ENCOUNTER — Encounter: Payer: Medicare Other | Admitting: Family Medicine

## 2021-04-30 DIAGNOSIS — Z1231 Encounter for screening mammogram for malignant neoplasm of breast: Secondary | ICD-10-CM | POA: Diagnosis not present

## 2021-04-30 LAB — HM MAMMOGRAPHY

## 2021-05-08 DIAGNOSIS — Z01419 Encounter for gynecological examination (general) (routine) without abnormal findings: Secondary | ICD-10-CM | POA: Diagnosis not present

## 2021-05-08 DIAGNOSIS — Z6823 Body mass index (BMI) 23.0-23.9, adult: Secondary | ICD-10-CM | POA: Diagnosis not present

## 2021-05-08 DIAGNOSIS — A609 Anogenital herpesviral infection, unspecified: Secondary | ICD-10-CM | POA: Diagnosis not present

## 2021-05-14 ENCOUNTER — Encounter: Payer: Medicare Other | Admitting: Family Medicine

## 2021-06-08 ENCOUNTER — Encounter: Payer: Medicare Other | Admitting: Family Medicine

## 2021-06-10 ENCOUNTER — Other Ambulatory Visit: Payer: Self-pay | Admitting: Family Medicine

## 2021-06-10 DIAGNOSIS — I700 Atherosclerosis of aorta: Secondary | ICD-10-CM

## 2021-06-10 DIAGNOSIS — E7800 Pure hypercholesterolemia, unspecified: Secondary | ICD-10-CM

## 2021-07-06 ENCOUNTER — Encounter: Payer: Medicare Other | Admitting: Family Medicine

## 2021-07-13 DIAGNOSIS — Z20822 Contact with and (suspected) exposure to covid-19: Secondary | ICD-10-CM | POA: Diagnosis not present

## 2021-09-01 ENCOUNTER — Ambulatory Visit (INDEPENDENT_AMBULATORY_CARE_PROVIDER_SITE_OTHER): Payer: Medicare Other | Admitting: Podiatry

## 2021-09-01 ENCOUNTER — Encounter: Payer: Self-pay | Admitting: Podiatry

## 2021-09-01 VITALS — BP 176/81 | HR 73 | Temp 98.6°F

## 2021-09-01 DIAGNOSIS — M79671 Pain in right foot: Secondary | ICD-10-CM | POA: Diagnosis not present

## 2021-09-01 DIAGNOSIS — I499 Cardiac arrhythmia, unspecified: Secondary | ICD-10-CM | POA: Insufficient documentation

## 2021-09-01 DIAGNOSIS — K219 Gastro-esophageal reflux disease without esophagitis: Secondary | ICD-10-CM | POA: Insufficient documentation

## 2021-09-01 DIAGNOSIS — K5900 Constipation, unspecified: Secondary | ICD-10-CM | POA: Insufficient documentation

## 2021-09-01 DIAGNOSIS — Z1211 Encounter for screening for malignant neoplasm of colon: Secondary | ICD-10-CM | POA: Insufficient documentation

## 2021-09-01 DIAGNOSIS — M2041 Other hammer toe(s) (acquired), right foot: Secondary | ICD-10-CM

## 2021-09-01 DIAGNOSIS — M542 Cervicalgia: Secondary | ICD-10-CM | POA: Insufficient documentation

## 2021-09-01 DIAGNOSIS — M2012 Hallux valgus (acquired), left foot: Secondary | ICD-10-CM

## 2021-09-01 DIAGNOSIS — M2042 Other hammer toe(s) (acquired), left foot: Secondary | ICD-10-CM | POA: Diagnosis not present

## 2021-09-01 DIAGNOSIS — M2011 Hallux valgus (acquired), right foot: Secondary | ICD-10-CM

## 2021-09-01 DIAGNOSIS — M25519 Pain in unspecified shoulder: Secondary | ICD-10-CM | POA: Insufficient documentation

## 2021-09-01 DIAGNOSIS — L84 Corns and callosities: Secondary | ICD-10-CM | POA: Diagnosis not present

## 2021-09-01 DIAGNOSIS — M79672 Pain in left foot: Secondary | ICD-10-CM

## 2021-09-01 DIAGNOSIS — K2930 Chronic superficial gastritis without bleeding: Secondary | ICD-10-CM | POA: Insufficient documentation

## 2021-09-01 DIAGNOSIS — G629 Polyneuropathy, unspecified: Secondary | ICD-10-CM | POA: Insufficient documentation

## 2021-09-01 DIAGNOSIS — R1013 Epigastric pain: Secondary | ICD-10-CM | POA: Insufficient documentation

## 2021-09-01 NOTE — Patient Instructions (Addendum)
Purchase Urea Cream and apply to calluses. File with pumice stone. ? ? ? ?Shoe and Sneaker Recommendations:  ?*Purchase sneakers with mesh (soft, stretchable) uppers. Avoid leather sneakers.* ? ?Recommend Skechers Loafers with stretchable uppers and memory foam insoles. They can be purchased at [employer], [employer] or [employer]. Also on [employer].   ? ?2. New Balance Sneakers 600 Series or Higher ([email or web address].[email or web address]). ?*Purchase sneakers with mesh (soft, stretchable) uppers. Avoid leather sneakers.* ? ?3.  Brooks Beast: *Purchase sneakers with mesh (soft, stretchable) uppers. Avoid leather sneakers.*  ? ?Corns and Calluses ?Corns are small areas of thickened skin that form on the top, sides, or tip of a toe. Corns have a cone-shaped core with a point that can press on a nerve below. This causes pain. ?Calluses are areas of thickened skin that can form anywhere on the body, including the hands, fingers, palms, soles of the feet, and heels. Calluses are usually larger than corns. ?What are the causes? ?Corns and calluses are caused by rubbing (friction) or pressure, such as from shoes that are too tight or do not fit properly. ?What increases the risk? ?Corns are more likely to develop in people who have misshapen toes (toe deformities), such as hammer toes. ?Calluses can form with friction to any area of the skin. They are more likely to develop in people who: ?Work with their hands. ?Wear shoes that fit poorly, are too tight, or are high-heeled. ?Have toe deformities. ?What are the signs or symptoms? ?Symptoms of a corn or callus include: ?A hard growth on the skin. ?Pain or tenderness under the skin. ?Redness and swelling. ?Increased discomfort while wearing tight-fitting shoes, if your feet are affected. ?If a corn or callus becomes infected, symptoms may include: ?Redness and swelling that gets worse. ?Pain. ?Fluid, blood, or pus draining from the corn or callus. ?How is this diagnosed? ?Corns and  calluses may be diagnosed based on your symptoms, your medical history, and a physical exam. ?How is this treated? ?Treatment for corns and calluses may include: ?Removing the cause of the friction or pressure. This may involve: ?Changing your shoes. ?Wearing shoe inserts (orthotics) or other protective layers in your shoes, such as a corn pad. ?Wearing gloves. ?Applying medicine to the skin (topical medicine) to help soften skin in the hardened, thickened areas. ?Removing layers of dead skin with a file to reduce the size of the corn or callus. ?Removing the corn or callus with a scalpel or laser. ?Taking antibiotic medicines, if your corn or callus is infected. ?Having surgery, if a toe deformity is the cause. ?Follow these instructions at home: ? ?Take over-the-counter and prescription medicines only as told by your health care provider. ?If you were prescribed an antibiotic medicine, take it as told by your health care provider. Do not stop taking it even if your condition improves. ?Wear shoes that fit well. Avoid wearing high-heeled shoes and shoes that are too tight or too loose. ?Wear any padding, protective layers, gloves, or orthotics as told by your health care provider. ?Soak your hands or feet. Then use a file or pumice stone to soften your corn or callus. Do this as told by your health care provider. ?Check your corn or callus every day for signs of infection. ?Contact a health care provider if: ?Your symptoms do not improve with treatment. ?You have redness or swelling that gets worse. ?Your corn or callus becomes painful. ?You have fluid, blood, or pus coming from your corn or callus. ?You have new symptoms. ?Get  help right away if: ?You develop severe pain with redness. ?Summary ?Corns are small areas of thickened skin that form on the top, sides, or tip of a toe. These can be painful. ?Calluses are areas of thickened skin that can form anywhere on the body, including the hands, fingers, palms, and  soles of the feet. Calluses are usually larger than corns. ?Corns and calluses are caused by rubbing (friction) or pressure, such as from shoes that are too tight or do not fit properly. ?Treatment may include wearing padding, protective layers, gloves, or orthotics as told by your health care provider. ?This information is not intended to replace advice given to you by your health care provider. Make sure you discuss any questions you have with your health care provider. ?Document Revised: 09/13/2019 Document Reviewed: 09/13/2019 ?Elsevier Patient Education ? [place]. ? ?

## 2021-09-03 ENCOUNTER — Other Ambulatory Visit: Payer: Self-pay | Admitting: Family Medicine

## 2021-09-03 DIAGNOSIS — E7800 Pure hypercholesterolemia, unspecified: Secondary | ICD-10-CM

## 2021-09-03 DIAGNOSIS — E559 Vitamin D deficiency, unspecified: Secondary | ICD-10-CM

## 2021-09-03 DIAGNOSIS — I700 Atherosclerosis of aorta: Secondary | ICD-10-CM

## 2021-09-06 ENCOUNTER — Encounter: Payer: Self-pay | Admitting: Podiatry

## 2021-09-06 IMAGING — CR DG CERVICAL SPINE COMPLETE 4+V
5 series · 5 of 5 positions shown · non-contrast
Comparison: None.

CLINICAL DATA: Neck pain. Headache for 3 weeks

EXAM:
CERVICAL SPINE - COMPLETE 4+ VIEW

[w c-spine lat]
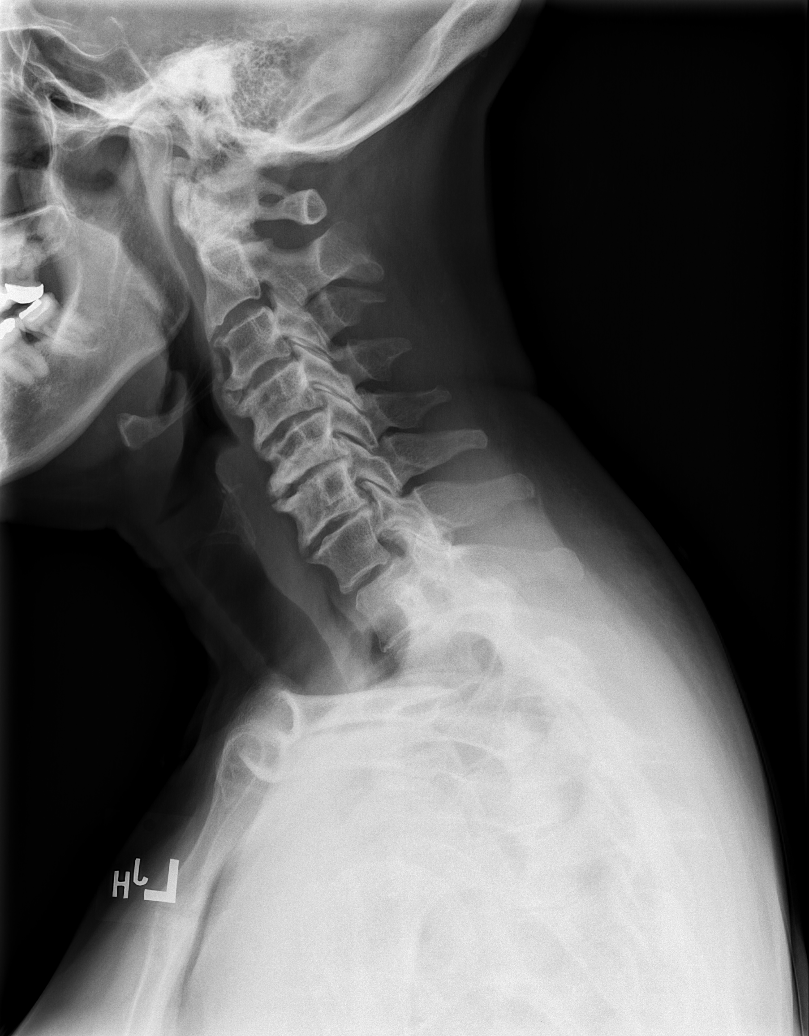

[w c-spine oblique (1 of 2)]
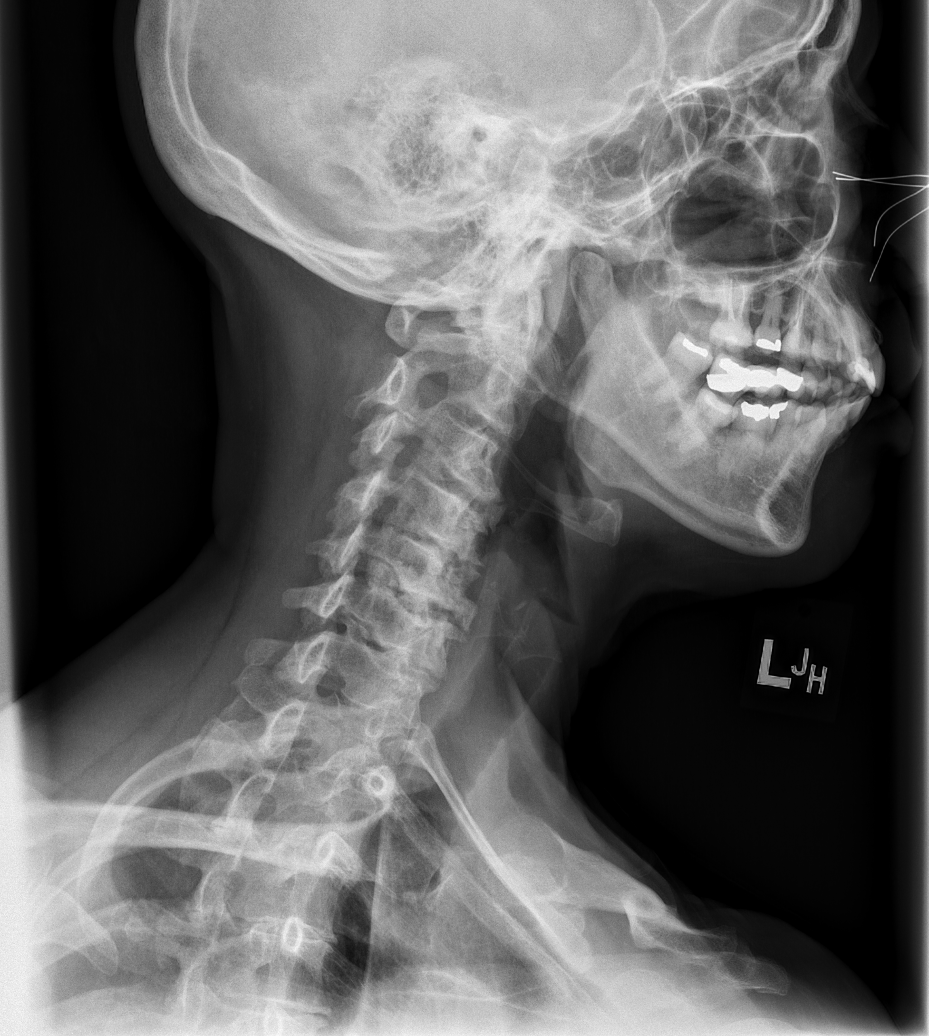

[w c-spine oblique (2 of 2)]
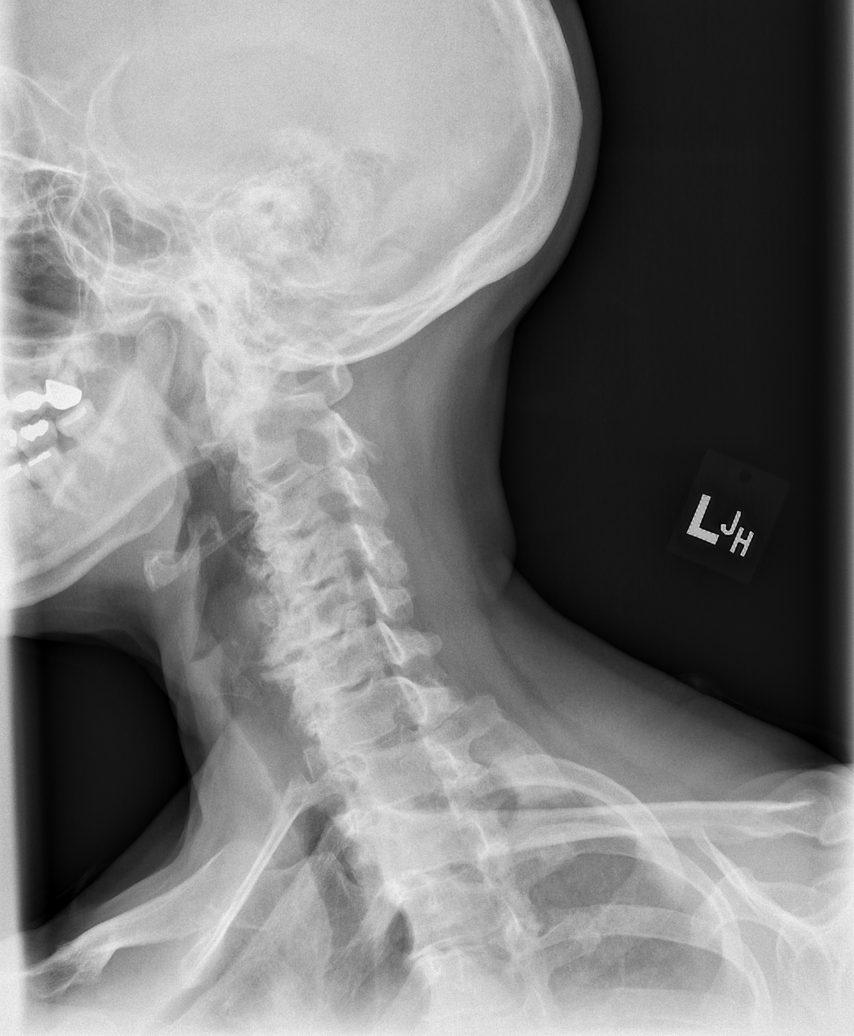

[w c-spine a.p. *]
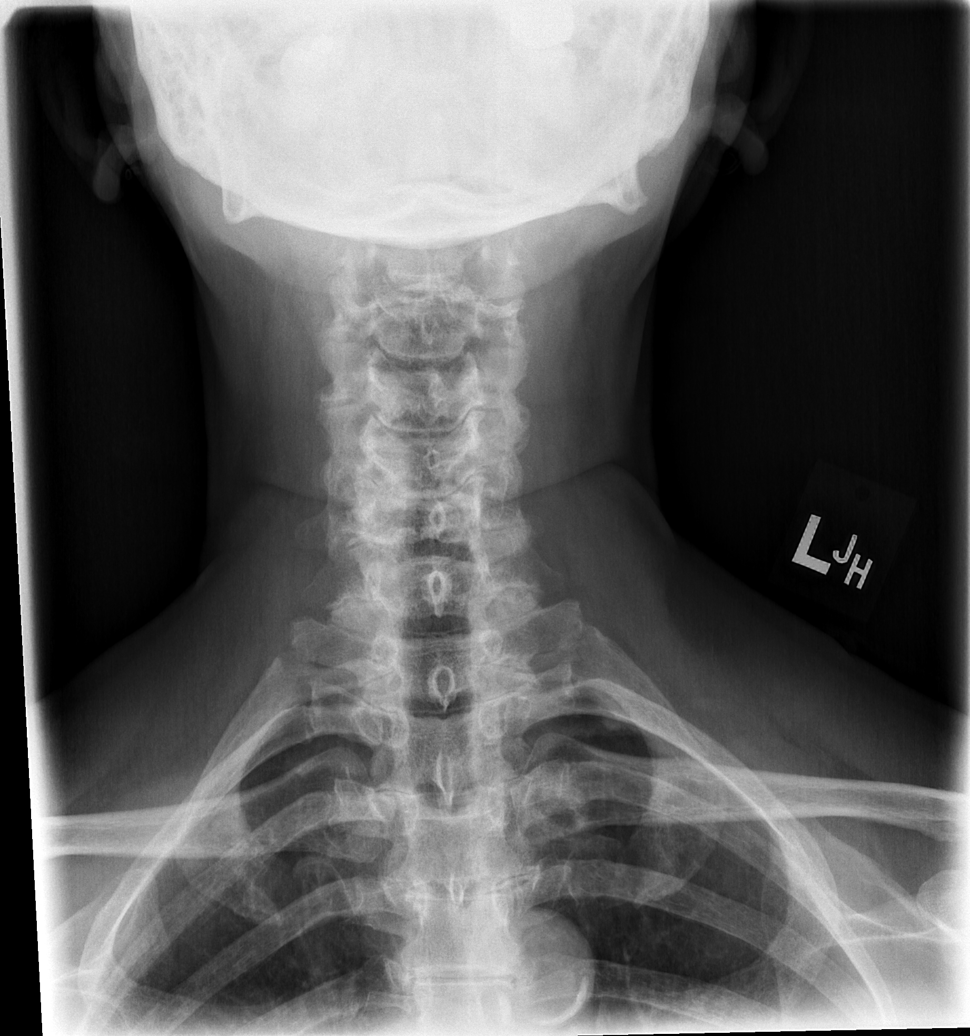

[w c-spine odontoid *]
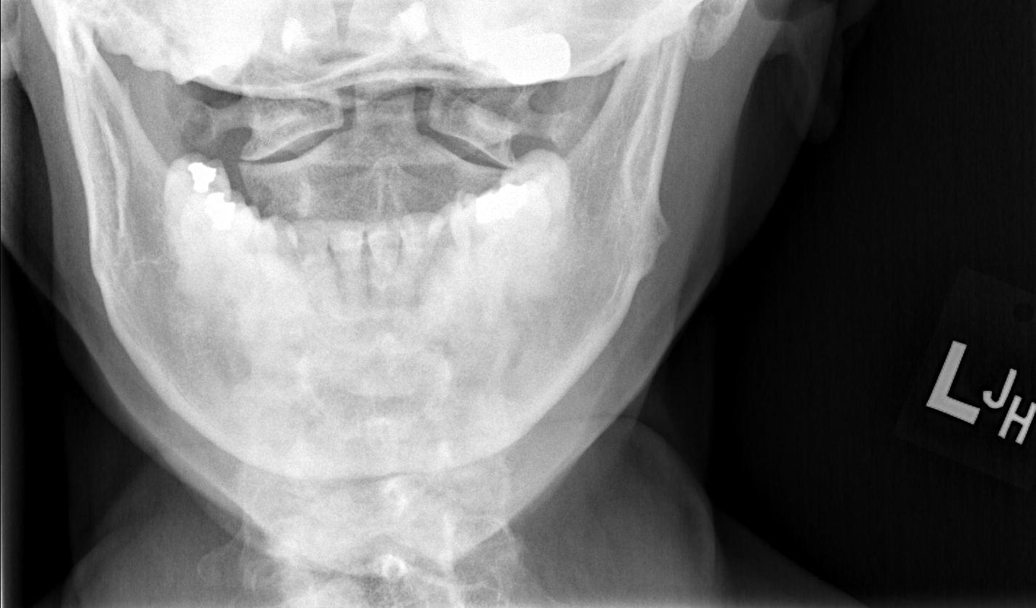

[5 of 5 positions shown; findings below may reference images not displayed]

FINDINGS: Normal alignment cervical vertebral bodies.

There is endplate spurring and sclerosis at C5-C6. Anterior
osteophytosis at C3-C4. No subluxation. No clear neural foraminal
narrowing. Posterior elements normal. Spinal laminal line normal.
Open mouth odontoid view normal.
IMPRESSION: Disc osteophytic disease at C3-C4 and C5-C6.

No acute findings.

## 2021-09-06 NOTE — Progress Notes (Signed)
Subjective: ?Annette Monroe presents today referred by Rita Ohara, MD for complaint of corn(s) b/l feet and callus(es) b/l feet for "a while".She likes to walk for exercise.  Pain interferes with ambulation. Painful corns and calluses are aggravated when weightbearing with and without shoegear. Most symptomatic are her plantar calluses left foot >right foot. ? ?Past Medical History:  ?Diagnosis Date  ? Abscessed tooth   ? Alopecia   ? Carpal tunnel syndrome of right wrist   ? Genital herpes   ? Heart murmur   ? History of esophagogastroduodenoscopy (EGD) 01/07/2021  ? Gastritis-Dr.Outlaw  ? Iritis 2004, 2007  ? Dr. Ricki Miller, and [facility]; negative work-up  ? Pneumonia age 63  ? pt was hospitalized at age 15  ? Unspecified vitamin D deficiency 2012  ? treated by Dr. Carren Rang initially, then here  ?  ? ?Patient Active Problem List  ? Diagnosis Date Noted  ? Cardiac arrhythmia 09/01/2021  ? Chronic superficial gastritis without bleeding 09/01/2021  ? Colon cancer screening 09/01/2021  ? Constipation 09/01/2021  ? Gastroesophageal reflux disease 09/01/2021  ? Neck pain 09/01/2021  ? Peripheral neuropathy 09/01/2021  ? Epigastric pain 09/01/2021  ? Shoulder joint pain 09/01/2021  ? Scarring alopecia 09/11/2019  ? Bilateral sensorineural hearing loss 01/04/2017  ? Recurrent vertigo 01/04/2017  ? Subjective tinnitus of right ear 01/04/2017  ? Thoracic aorta atherosclerosis ([place]) 04/02/2015  ? Atherosclerosis of aorta ([place]) 08/22/2014  ? Allergic rhinitis 08/20/2013  ? Chest pain 07/12/2012  ? Palpitations 07/12/2012  ? Arthritis of right knee 07/12/2012  ? Genital herpes 02/08/2011  ? Vitamin D deficiency 02/08/2011  ? Carpal tunnel syndrome 02/08/2011  ?  ? ?Past Surgical History:  ?Procedure Laterality Date  ? ABDOMINAL HYSTERECTOMY  2000  ? partial-ovaries remaining  ? APPENDECTOMY  1981  ? BREAST SURGERY  2004  ? breast reduction  ? DIAGNOSTIC LAPAROSCOPY  1999  ?  ? ?Current Outpatient Medications on File  Prior to Visit  ?Medication Sig Dispense Refill  ? famotidine (PEPCID) 20 MG tablet 1 tablet    ? pantoprazole (PROTONIX) 40 MG tablet 1 tablet    ? acetaminophen (TYLENOL) 500 MG tablet Take 500 mg by mouth every 6 (six) hours as needed. (Patient not taking: No sig reported)    ? BIOTIN PO Take by mouth.     ? clobetasol ointment (TEMOVATE) 7.16 % Apply 1 application topically 2 (two) times daily.    ? loratadine (CLARITIN) 10 MG tablet Take 10 mg by mouth daily. Reported on 08/25/2015 (Patient not taking: No sig reported)    ? Multiple Vitamins-Minerals (CENTRUM ULTRA WOMENS PO) Take 1 tablet by mouth daily.    ? naproxen sodium (ANAPROX) 220 MG tablet Take 440 mg by mouth as needed. (Patient not taking: No sig reported)    ? rosuvastatin (CRESTOR) 10 MG tablet TAKE 1 TABLET(10 MG) BY MOUTH 2 TIMES A WEEK 25 tablet 0  ? valACYclovir (VALTREX) 500 MG tablet 1 tablet    ? Vitamin D, Ergocalciferol, (DRISDOL) 1.25 MG (50000 UNIT) CAPS capsule TAKE 1 CAPSULE BY MOUTH EVERY 14 DAYS (Patient taking differently: Take 50,000 Units by mouth every 30 (thirty) days.) 6 capsule 1  ? ?No current facility-administered medications on file prior to visit.  ?  ? ?Allergies  ?Allergen Reactions  ? Benadryl [Diphenhydramine]   ?  Gives her palpitations  ? Finasteride Other (See Comments)  ?  Breast tenderness/fullness  ? Penicillins Hives  ?  ? ?Social History  ? ?  Occupational History  ? Occupation: retired  ?  Employer: [employer]  ?Tobacco Use  ? Smoking status: Never  ? Smokeless tobacco: Never  ?Vaping Use  ? Vaping Use: Never used  ?Substance and Sexual Activity  ? Alcohol use: No  ?  Comment: rarely, maybe once a year.  ? Drug use: No  ? Sexual activity: Not Currently  ?  ? ?Family History  ?Problem Relation Age of Onset  ? Hypertension Mother   ? Diabetes Mother   ? Stroke Mother   ? Dementia Mother   ? Cancer Mother   ?     leukemia (chronic)  ? Depression Mother   ? Diabetes Father   ? Hypertension Father   ? Parkinsonism Father    ? Hypertension Sister   ? COPD Sister   ? Heart disease Sister 51  ?     (smoker)  ? Hypertension Brother   ? Stroke Brother   ? Hypertension Brother   ? Hyperlipidemia Brother   ? Hyperlipidemia Brother   ? Hypertension Brother   ?  ? ?Immunization History  ?Administered Date(s) Administered  ? Fluad Quad(high Dose 65+) 02/12/2021  ? Influenza Split 03/01/2011, 02/15/2012, 03/05/2019  ? Influenza Whole 03/28/2007  ? Influenza, High Dose Seasonal PF 03/19/2013, 02/28/2014  ? Influenza,inj,Quad PF,6+ Mos 03/10/2015, 03/03/2016, 03/02/2017, 03/29/2018, 03/26/2020  ? PFIZER Comirnaty(Gray Top)Covid-19 Tri-Sucrose Vaccine 10/07/2020  ? PFIZER(Purple Top)SARS-COV-2 Vaccination 07/05/2019, 07/30/2019, 02/23/2020  ? Pension scheme manager 54yr & up 03/09/2021  ? Pneumococcal Conjugate-13 08/20/2013  ? Pneumococcal Polysaccharide-23 08/22/2014  ? Td 07/05/1995  ? Tdap 02/08/2011  ? Zoster, Live 01/04/2013  ?  ? ?Objective: ?Annette CHAVISis a pleasant 76y.o. female WD, WN in NAD. AAO x 3. ? ?Vitals:  ? 09/01/21 0945  ?BP: (!) 176/81  ?Pulse: 73  ?Temp: 98.6 ?F (37 ?C)  ?SpO2: 100%  ? ?Vascular Examination:  ?CFT immediate b/l LE. Palpable DP/PT pulses b/l LE. Digital hair sparse b/l. Skin temperature gradient WNL b/l. No pain with calf compression b/l. No edema noted b/l. No cyanosis or clubbing noted b/l LE. ? ?Dermatological Examination: ?Pedal integument with normal turgor, texture and tone BLE. No open wounds b/l LE. No interdigital macerations noted b/l LE. Toenails 1-5 b/l well maintained with adequate length. No erythema, no edema, no drainage, no fluctuance. Hyperkeratotic lesion(s) lateral nailfold bilateral 5th digits, bilateral great toes, and submet head 5 b/l.  No erythema, no edema, no drainage, no fluctuance. ? ?Musculoskeletal: ?Normal muscle strength 5/5 to all lower extremity muscle groups bilaterally. HAV with bunion deformity noted b/l LE. Hammertoe deformity noted 2-5 b/l..Marland KitchenNo  pain, crepitus or joint limitation noted with ROM b/l LE.  Patient ambulates independently without assistive aids. ? ?Neurological: ?Protective sensation intact 5/5 intact bilaterally with 10g monofilament b/l. Vibratory sensation intact b/l. Proprioception intact bilaterally. ? ?Assessment: ?1. Corns and callosities   ?2. Hallux valgus, acquired, bilateral   ?3. Acquired hammertoes of both feet   ?4. Pain in both feet   ?  ?Plan: ?-Patient was evaluated and treated. All patient's and/or POA's questions/concerns answered on today's visit. ?-As a courtesy, calluses pared without incident. Discussed using Urea Cream and pumice stone to keep them filed down. Shoe recommendation list given at visit today . ?-Patient/POA to call should there be question/concern in the interim. ? ?Return in about 3 months (around 12/01/2021). ? ?JMarzetta Board DPM ?

## 2021-09-07 ENCOUNTER — Other Ambulatory Visit: Payer: Self-pay | Admitting: Family Medicine

## 2021-09-07 DIAGNOSIS — E7800 Pure hypercholesterolemia, unspecified: Secondary | ICD-10-CM

## 2021-09-07 DIAGNOSIS — I700 Atherosclerosis of aorta: Secondary | ICD-10-CM

## 2022-02-03 ENCOUNTER — Encounter: Payer: Self-pay | Admitting: Internal Medicine

## 2022-02-05 DIAGNOSIS — H25813 Combined forms of age-related cataract, bilateral: Secondary | ICD-10-CM | POA: Diagnosis not present

## 2022-02-05 DIAGNOSIS — H35033 Hypertensive retinopathy, bilateral: Secondary | ICD-10-CM | POA: Diagnosis not present

## 2022-02-05 DIAGNOSIS — H40013 Open angle with borderline findings, low risk, bilateral: Secondary | ICD-10-CM | POA: Diagnosis not present

## 2022-02-05 DIAGNOSIS — H11153 Pinguecula, bilateral: Secondary | ICD-10-CM | POA: Diagnosis not present

## 2022-02-21 ENCOUNTER — Other Ambulatory Visit: Payer: Self-pay | Admitting: Family Medicine

## 2022-02-21 DIAGNOSIS — E559 Vitamin D deficiency, unspecified: Secondary | ICD-10-CM

## 2022-02-21 DIAGNOSIS — E7800 Pure hypercholesterolemia, unspecified: Secondary | ICD-10-CM

## 2022-02-21 DIAGNOSIS — I700 Atherosclerosis of aorta: Secondary | ICD-10-CM

## 2022-02-22 NOTE — Telephone Encounter (Signed)
Pt has enough until her appt

## 2022-02-24 NOTE — Patient Instructions (Incomplete)
HEALTH MAINTENANCE RECOMMENDATIONS:  It is recommended that you get at least 30 minutes of aerobic exercise at least 5 days/week (for weight loss, you may need as much as 60-90 minutes). This can be any activity that gets your heart rate up. This can be divided in 10-15 minute intervals if needed, but try and build up your endurance at least once a week.  Weight bearing exercise is also recommended twice weekly.  Eat a healthy diet with lots of vegetables, fruits and fiber.  "Colorful" foods have a lot of vitamins (ie green vegetables, tomatoes, red peppers, etc).  Limit sweet tea, regular sodas and alcoholic beverages, all of which has a lot of calories and sugar.  Up to 1 alcoholic drink daily may be beneficial for women (unless trying to lose weight, watch sugars).  Drink a lot of water.  Calcium recommendations are 1200-1500 mg daily (1500 mg for postmenopausal women or women without ovaries), and vitamin D 1000 IU daily.  This should be obtained from diet and/or supplements (vitamins), and calcium should not be taken all at once, but in divided doses.  Monthly self breast exams and yearly mammograms for women over the age of 26 is recommended.  Sunscreen of at least SPF 30 should be used on all sun-exposed parts of the skin when outside between the hours of 10 am and 4 pm (not just when at beach or pool, but even with exercise, golf, tennis, and yard work!)  Use a sunscreen that says "broad spectrum" so it covers both UVA and UVB rays, and make sure to reapply every 1-2 hours.  Remember to change the batteries in your smoke detectors when changing your clock times in the spring and fall. Carbon monoxide detectors are recommended for your home.  Use your seat belt every time you are in a car, and please drive safely and not be distracted with cell phones and texting while driving.   Annette Monroe , Thank you for taking time to come for your Medicare Wellness Visit. I appreciate your ongoing  commitment to your health goals. Please review the following plan we discussed and let me know if I can assist you in the future.   This is a list of the screening recommended for you and due dates:  Health Maintenance  Topic Date Due   Zoster (Shingles) Vaccine (1 of 2) Never done   Tetanus Vaccine  02/07/2021   COVID-19 Vaccine (6 - Pfizer series) 07/10/2021   Flu Shot  12/29/2021   Mammogram  04/30/2022   Colon Cancer Screening  03/27/2028   Pneumonia Vaccine  Completed   DEXA scan (bone density measurement)  Completed   Hepatitis C Screening: USPSTF Recommendation to screen - Ages 27-79 yo.  Completed   HPV Vaccine  Aged Out   Please bring [place] copies of your Living Will and [place] so that it can be scanned into your medical chart.  The following vaccines are recommended-- Tdap (tetanus booster)--you need to get this from the pharmacy. You are past due for this.  RSV vaccine--this is covered by Medicare part D, so you need to get this from the pharmacy.  Updated bivalent COVID booster--you can get this from our office (in 2 weeks from today's flu shot, since we do not yet have this vaccine)  I recommend getting the new shingles vaccine (Shingrix). Since you have Medicare, you will need to get this from the pharmacy, as it is covered by Part D. This is  a series of 2 injections, spaced 2 months apart.    All vaccines should be separated from other vaccines by at least 2 weeks. (My preferred order is flu, COVID, RSV, TdaP and shingrix)  Consider using Pepcid PRIOR to a meal that is likely to trigger your stomach burning sensation.

## 2022-02-24 NOTE — Progress Notes (Signed)
Chief Complaint  Patient presents with   Medicare Wellness    Fasting AWV, no pap sees Dr. Corinna Capra. She removed PCN when she did e check in. She took amox a few years ago and was fine. Was told 40 years ago and thinks she is ok.     Annette Monroe is a 76 y.o. female who presents for annual wellness visit and follow-up on chronic medical conditions.  She is under the care of GYN, seen in 04/2021.  GERD:  This was evaluated/treated by [facility]. She had unremarkable CT of abdomen in 07/2020 (colonic diverticulosis, aortic atherosclerosis noted). PPI was prescribed, and she underwent EGD in 12/2020 which showed gastritis, biopsy was negative for H.pylori.  She no longer takes any PPI. She still has some occasional burning in her upper stomach, related to eating.  Hasn't found a trigger. Denies dysphagia or abdominal pain. Denies "heartburn" (nothing in her chest).  Elevated blood pressures: She didn't tolerate HCTZ in the past (due to heaviness in her chest, and a "fluttering awareness").  She continues to try and limit the sodium in her diet.  BP's are checked regularly at home-- Running 119-146/68-80 in the mornings (averaging 130 (or low 130's)/75); Later in the day 123-149/70-79, mostly 135-145/75. Denies headaches, dizziness, chest pain or shortness of breath. She had some mild slight vertigo/equilibrium issues in the last couple of weeks. She has some nasal congestion in the morning, no terrible flare of allergies.  BP Readings from Last 3 Encounters:  02/25/22 120/78  09/01/21 (!) 176/81  02/12/21 140/84   H/o occipital headaches/neck pain.  C-spine films showed disc osteophytic disease at C3-C4 and C5-C6. She continues to use stand for her iPad to help limit looking down, and her headaches and neck pain remain improved.  No longer does stretches. Her morning occipital discomfort improved after getting a new pillow.  History of vitamin D deficiency: Level was slightly low at 29.8 in  01/2020 when taking 50,000 IU of vitamin D once a month (on the 15th of the month). This was changed to taking it every 2 weeks. Recheck in 07/2020 was 45.9. She switches to once a month through the summer months. Last level was 41.5 in 01/2021 on this regimen (had taken it monthly over the summer). Currently taking it once a month (switches in October to q2 weeks).  Hyperlipidemia/atherosclerosis.  She was started on Crestor in November 2021 for atherosclerosis (thoracic aorta, seen on CXR 07/2014) . ASCVD risk was 13.5%. Coronary calcium 01/2020 was 0, but calcifications were noted in aortic root and descending thoracic aorta. She is taking Crestor '10mg'$  twice a week, and tolerating it. She denies myalgias. She continues to try and follow a low cholesterol diet (doesn't eat red meat, uses vinaigrette dressings, uses 2% string cheese as a snack, eating more egg whites).  Recheck of lipids after starting Crestor was good: Lab Results  Component Value Date   CHOL 144 02/12/2021   HDL 58 02/12/2021   LDLCALC 72 02/12/2021   TRIG 69 02/12/2021   CHOLHDL 2.5 02/12/2021    Immunization History  Administered Date(s) Administered   Fluad Quad(high Dose 65+) 02/12/2021   Influenza Split 03/01/2011, 02/15/2012, 03/05/2019   Influenza Whole 03/28/2007   Influenza, High Dose Seasonal PF 03/19/2013, 02/28/2014   Influenza,inj,Quad PF,6+ Mos 03/10/2015, 03/03/2016, 03/02/2017, 03/29/2018, 03/26/2020   PFIZER Comirnaty(Gray Top)Covid-19 Tri-Sucrose Vaccine 10/07/2020   PFIZER(Purple Top)SARS-COV-2 Vaccination 07/05/2019, 07/30/2019, 02/23/2020   Pfizer Covid-19 Vaccine Bivalent Booster 77yr & up 03/09/2021  Pneumococcal Conjugate-13 08/20/2013   Pneumococcal Polysaccharide-23 08/22/2014   Td 07/05/1995   Tdap 02/08/2011   Zoster, Live 01/04/2013   Last Pap smear: 09/2017, Dr. Corinna Capra, normal. S/p hysterectomy. Sees GYN yearly, last 04/2021 Last mammogram: 04/2021 Last colonoscopy: 02/2018 internal  hemorrhoids and diverticulosis (Dr. Paulita Fujita). DEXA: 01/2014, normal, done through Dr. Roque Cash office Dentist: twice yearly and periodontist twice yearly. Going more recently due to a tooth issue. Ophtho: once yearly.  Exercise: "not very good". In Spring/Summer she walked at [facility], cut back when it got too hot.  She did DVD's indoors instead, but got out of the routine, exercising only once every 2-3 weeks in Aug/September. Hasn't been to the gym since [place].     Patient Care Team: Rita Ohara, MD as PCP - General (Family Medicine) Hortencia Pilar, MD as Consulting Physician (Surgery) Dr. Mathis Fare, DDS as Consulting Physician (Dentistry) Louretta Shorten, MD as Consulting Physician (Obstetrics and Gynecology) Royston Cowper, DDS as Consulting Physician (Dentistry) Arta Silence, MD as Consulting Physician (Gastroenterology) Loney Loh, MD as Consulting Physician (Dermatology) Melida Quitter, MD as Consulting Physician (Otolaryngology) Now sees Dr. Lucianne Lei (Dr. Kathlen Mody moved)  Depression Screening: [place] Office Visit from 02/25/2022 in [place]  PHQ-2 Total Score 0       Falls screen:     02/25/2022    9:43 AM 02/12/2021    8:34 AM 02/07/2020    8:46 AM 04/25/2019   11:35 AM 01/29/2019    1:48 PM  Fall Risk   Falls in the past year? 0 0 0 0 0  Comment    [place] Telephone Survey: data to providers prior to load   Number falls in past yr: 0 0     Injury with Fall? 0 0     Risk for fall due to :  No Fall Risks     Follow up  Falls evaluation completed       Functional Status Survey: Is the patient deaf or have difficulty hearing?: No (same as last year, some loss) Does the patient have difficulty seeing, even when wearing glasses/contacts?: No Does the patient have difficulty concentrating, remembering, or making decisions?: No Does the patient have difficulty walking or climbing stairs?: No Does the patient have difficulty dressing or bathing?:  No Does the patient have difficulty doing errands alone such as visiting a doctor's office or shopping?: No  Mini-Cog Scoring: 5    End of Life Discussion:  Patient has a living will and medical power of attorney. We do not have copies. She recently updated these.  PMH, PSH, SH and FH were reviewed  Outpatient Encounter Medications as of 02/25/2022  Medication Sig Note   BIOTIN PO Take by mouth.     Multiple Vitamins-Minerals (CENTRUM ULTRA WOMENS PO) Take 1 tablet by mouth daily.    rosuvastatin (CRESTOR) 10 MG tablet TAKE 1 TABLET(10 MG) BY MOUTH 2 TIMES A WEEK    Vitamin D, Ergocalciferol, (DRISDOL) 1.25 MG (50000 UNIT) CAPS capsule TAKE 1 CAPSULE BY MOUTH EVERY 14 DAYS 02/25/2022: Once a month in summer months   acetaminophen (TYLENOL) 500 MG tablet Take 500 mg by mouth every 6 (six) hours as needed. (Patient not taking: Reported on 07/03/2020) 02/25/2022: prn   clobetasol ointment (TEMOVATE) 9.52 % Apply 1 application topically 2 (two) times daily. (Patient not taking: Reported on 02/25/2022) 02/25/2022: prn   famotidine (PEPCID) 20 MG tablet 1 tablet (Patient not taking: Reported on 02/25/2022) 02/25/2022: prn   loratadine (  CLARITIN) 10 MG tablet Take 10 mg by mouth daily. Reported on 08/25/2015 (Patient not taking: Reported on 02/07/2020) 02/25/2022: prn   valACYclovir (VALTREX) 500 MG tablet 1 tablet (Patient not taking: Reported on 02/25/2022) 02/25/2022: prn   [DISCONTINUED] naproxen sodium (ANAPROX) 220 MG tablet Take 440 mg by mouth as needed. (Patient not taking: Reported on 02/07/2020)    [DISCONTINUED] pantoprazole (PROTONIX) 40 MG tablet 1 tablet    No facility-administered encounter medications on file as of 02/25/2022.   Allergies  Allergen Reactions   Benadryl [Diphenhydramine]     Gives her palpitations   Finasteride Other (See Comments)    Breast tenderness/fullness    ROS: The patient denies anorexia, fever,  headaches, vision changes, ear pain, sore throat, breast  concerns,syncope, dyspnea on exertion, cough, swelling, nausea, vomiting, diarrhea, constipation, abdominal pain, melena, hematochezia, indigestion/heartburn, hematuria, incontinence, dysuria, vaginal bleeding or discharge, odor or itch, weakness, tremor, suspicious skin lesions, depression, anxiety, abnormal bleeding/bruising, or enlarged lymph nodes.   genital herpes outbreaks, infrequent. Chronic tinnitus, no true vertigo, slight weird sensation as described above.  Seasonal allergies, not flaring currently. Just some morning congestion. Alopecia, no longer sees Dr. Leonie Green. Got an expensive wig, doesn't like it (too much hair), not wearing. Skin tag below bra line occasionally gets irritated (on left side).   PHYSICAL EXAM:  BP 120/78   Pulse 76   Ht '5\' 7"'$  (1.702 m)   Wt 149 lb 9.6 oz (67.9 kg)   BMI 23.43 kg/m   Wt Readings from Last 3 Encounters:  02/25/22 149 lb 9.6 oz (67.9 kg)  02/12/21 148 lb (67.1 kg)  07/03/20 151 lb 6.4 oz (68.7 kg)    General Appearance:    Alert, cooperative, no distress, appears stated age  Head:    Normocephalic, without obvious abnormality, atraumatic     Eyes:    Pupils are equal, round, reactive; conjunctiva/corneas clear, EOM's intact; fundi not well visualized today  Ears:    Normal TM's and external ear canals.   Nose:    Normal mucosa, no significant edema, no mucus, no sinus tenderness   Throat:    Normal mucosa, no lesions  Neck:    Supple, no lymphadenopathy; thyroid: no enlargement/ tenderness/nodules; no carotid bruit or JVD     Back:    Spine nontender, no curvature, ROM normal, no CVA tenderness   Lungs:    Clear to auscultation bilaterally without wheezes, rales or ronchi; respirations unlabored     Chest Wall:    No tenderness or deformity     Heart:    Regular rate and rhythm, S1 and S2 normal, no murmur, rub or gallop     Breast Exam:    Deferred to GYN     Abdomen:    Soft, non-tender, nondistended, normoactive bowel sounds, no  masses, no hepatosplenomegaly     Genitalia:    Deferred to GYN            Extremities:    No clubbing, cyanosis or edema.  Pulses:    2+ and symmetric all extremities.  Skin:    Skin color, texture, turgor normal, no rashes or lesions. She has diffuse, significant thinning of hair on top of her head. Scalp is normal (no inflammation, flaking). Curvy shaped mole just to the right of thoracic spine, uniform color, unchanged (compared to photo on her phone from 2019). Very small skin tag under L breast, not currently irritated.  Pt in gown for waist up exam, wearing pants,  legs not examined.   Lymph nodes:    Cervical, supraclavicular nodes normal     Neurologic:    Normal strength, sensation and gait; reflexes 2+ and symmetric throughout                       Psych:   Normal mood, affect, hygiene and grooming   ASSESSMENT/PLAN:  Medicare annual wellness visit, subsequent  Gastroesophageal reflux disease without esophagitis - h/o gastritis; Avoid NSAIDS. reviewed proper diet, and OTC meds for prn use (vs resuming PPI if persistent/worsening)  Pure hypercholesterolemia - cont low cholesterol diet - Plan: Lipid panel, rosuvastatin (CRESTOR) 10 MG tablet  Blood pressure elevated without history of HTN - cont low Na diet, resume regular exercise.  cont to monitor and f/u if BP persistently >135-140/85-90  Need for influenza vaccination - Plan: Flu Vaccine QUAD High Dose(Fluad)  Medication monitoring encounter - Plan: Comprehensive metabolic panel, CBC with Differential/Platelet, Lipid panel  Thoracic aorta atherosclerosis (HCC) - cont crestor 2x/week - Plan: rosuvastatin (CRESTOR) 10 MG tablet  Vitamin D deficiency - cont current regimen--monthly in the summer, q2 weeks the rest of the year - Plan: Vitamin D, Ergocalciferol, (DRISDOL) 1.25 MG (50000 UNIT) CAPS capsule  Alopecia - no longer sees derm.  Counseled/discussed re: wigs.   Discussed monthly self breast exams and yearly mammograms;  at least 30 minutes of aerobic activity at least 5 days/week, weight bearing exercise at least 2x/wk; proper sunscreen use reviewed; healthy diet, including goals of calcium and vitamin D intake and alcohol recommendations (less than or equal to 1 drink/day) reviewed; regular seatbelt use; changing batteries in smoke detectors. Immunization recommendations discussed-- Continue yearly flu shots, given today.  Updated COVID booster recommended, to wait 2 weeks from today's vaccine. RSV vaccine recommended, to get from pharmacy. Tdap booster due now, to get from pharmacy. Discussed need to separate vaccines by 2 weeks. Shingrix recommended, risks/side effects reviewed; to get from pharmacy.  Colon cancer screening UTD.   MOST form reviewed, updated. Full Code, Full Care Asked her to get [place] copies of her living will and healthcare power of attorney.   Medicare Attestation I have personally reviewed: The patient's medical and social history Their use of alcohol, tobacco or illicit drugs Their current medications and supplements The patient's functional ability including ADLs,fall risks, home safety risks, cognitive, and hearing and visual impairment Diet and physical activities Evidence for depression or mood disorders  The patient's weight, height, BMI have been recorded in the chart.  I have made referrals, counseling, and provided education to the patient based on review of the above and I have provided the patient with a written personalized care plan for preventive services.

## 2022-02-25 ENCOUNTER — Encounter: Payer: Self-pay | Admitting: Family Medicine

## 2022-02-25 ENCOUNTER — Ambulatory Visit (INDEPENDENT_AMBULATORY_CARE_PROVIDER_SITE_OTHER): Payer: Medicare Other | Admitting: Family Medicine

## 2022-02-25 VITALS — BP 120/78 | HR 76 | Ht 67.0 in | Wt 149.6 lb

## 2022-02-25 DIAGNOSIS — E559 Vitamin D deficiency, unspecified: Secondary | ICD-10-CM

## 2022-02-25 DIAGNOSIS — L659 Nonscarring hair loss, unspecified: Secondary | ICD-10-CM | POA: Diagnosis not present

## 2022-02-25 DIAGNOSIS — K219 Gastro-esophageal reflux disease without esophagitis: Secondary | ICD-10-CM | POA: Diagnosis not present

## 2022-02-25 DIAGNOSIS — Z23 Encounter for immunization: Secondary | ICD-10-CM

## 2022-02-25 DIAGNOSIS — Z0000 Encounter for general adult medical examination without abnormal findings: Secondary | ICD-10-CM | POA: Diagnosis not present

## 2022-02-25 DIAGNOSIS — R030 Elevated blood-pressure reading, without diagnosis of hypertension: Secondary | ICD-10-CM

## 2022-02-25 DIAGNOSIS — E7800 Pure hypercholesterolemia, unspecified: Secondary | ICD-10-CM | POA: Diagnosis not present

## 2022-02-25 DIAGNOSIS — Z5181 Encounter for therapeutic drug level monitoring: Secondary | ICD-10-CM | POA: Diagnosis not present

## 2022-02-25 DIAGNOSIS — I700 Atherosclerosis of aorta: Secondary | ICD-10-CM | POA: Diagnosis not present

## 2022-02-25 MED ORDER — VITAMIN D (ERGOCALCIFEROL) 1.25 MG (50000 UNIT) PO CAPS: ORAL_CAPSULE | ORAL | 3 refills | Status: DC

## 2022-02-25 MED ORDER — ROSUVASTATIN CALCIUM 10 MG PO TABS: ORAL_TABLET | ORAL | 3 refills | Status: DC

## 2022-02-26 LAB — CBC WITH DIFFERENTIAL/PLATELET
Basophils Absolute: 0 10*3/uL (ref 0.0–0.2)
Basos: 0 %
EOS (ABSOLUTE): 0.1 10*3/uL (ref 0.0–0.4)
Eos: 1 %
Hematocrit: 40.4 % (ref 34.0–46.6)
Hemoglobin: 14.1 g/dL (ref 11.1–15.9)
Immature Grans (Abs): 0 10*3/uL (ref 0.0–0.1)
Immature Granulocytes: 0 %
Lymphocytes Absolute: 2.3 10*3/uL (ref 0.7–3.1)
Lymphs: 33 %
MCH: 32.7 pg (ref 26.6–33.0)
MCHC: 34.9 g/dL (ref 31.5–35.7)
MCV: 94 fL (ref 79–97)
Monocytes Absolute: 0.5 10*3/uL (ref 0.1–0.9)
Monocytes: 7 %
Neutrophils Absolute: 4.2 10*3/uL (ref 1.4–7.0)
Neutrophils: 59 %
Platelets: 328 10*3/uL (ref 150–450)
RBC: 4.31 x10E6/uL (ref 3.77–5.28)
RDW: 11.9 % (ref 11.7–15.4)
WBC: 7.1 10*3/uL (ref 3.4–10.8)

## 2022-02-26 LAB — COMPREHENSIVE METABOLIC PANEL
ALT: 18 IU/L (ref 0–32)
AST: 17 IU/L (ref 0–40)
Albumin/Globulin Ratio: 1.9 (ref 1.2–2.2)
Albumin: 4.5 g/dL (ref 3.8–4.8)
Alkaline Phosphatase: 95 IU/L (ref 44–121)
BUN/Creatinine Ratio: 17 (ref 12–28)
BUN: 13 mg/dL (ref 8–27)
Bilirubin Total: 0.7 mg/dL (ref 0.0–1.2)
CO2: 23 mmol/L (ref 20–29)
Calcium: 9.8 mg/dL (ref 8.7–10.3)
Chloride: 102 mmol/L (ref 96–106)
Creatinine, Ser: 0.75 mg/dL (ref 0.57–1.00)
Globulin, Total: 2.4 g/dL (ref 1.5–4.5)
Glucose: 93 mg/dL (ref 70–99)
Potassium: 4.3 mmol/L (ref 3.5–5.2)
Sodium: 139 mmol/L (ref 134–144)
Total Protein: 6.9 g/dL (ref 6.0–8.5)
eGFR: 84 mL/min/{1.73_m2} (ref >59)

## 2022-02-26 LAB — LIPID PANEL
Chol/HDL Ratio: 2.5 ratio (ref 0.0–4.4)
Cholesterol, Total: 162 mg/dL (ref 100–199)
HDL: 64 mg/dL (ref >39)
LDL Chol Calc (NIH): 82 mg/dL (ref 0–99)
Triglycerides: 84 mg/dL (ref 0–149)
VLDL Cholesterol Cal: 16 mg/dL (ref 5–40)

## 2022-03-09 ENCOUNTER — Encounter: Payer: Self-pay | Admitting: Internal Medicine

## 2022-04-05 ENCOUNTER — Other Ambulatory Visit (INDEPENDENT_AMBULATORY_CARE_PROVIDER_SITE_OTHER): Payer: Medicare Other

## 2022-04-05 DIAGNOSIS — Z23 Encounter for immunization: Secondary | ICD-10-CM

## 2022-05-03 DIAGNOSIS — Z1231 Encounter for screening mammogram for malignant neoplasm of breast: Secondary | ICD-10-CM | POA: Diagnosis not present

## 2022-05-03 LAB — HM MAMMOGRAPHY

## 2022-05-06 ENCOUNTER — Encounter: Payer: Self-pay | Admitting: *Deleted

## 2022-06-04 ENCOUNTER — Ambulatory Visit
Admission: EM | Admit: 2022-06-04 | Discharge: 2022-06-04 | Disposition: A | Discharge status: Home / Self Care | Payer: Medicare Other | Attending: Emergency Medicine | Admitting: Emergency Medicine

## 2022-06-04 ENCOUNTER — Ambulatory Visit (INDEPENDENT_AMBULATORY_CARE_PROVIDER_SITE_OTHER): Payer: Medicare Other

## 2022-06-04 ENCOUNTER — Encounter: Payer: Self-pay | Admitting: Emergency Medicine

## 2022-06-04 DIAGNOSIS — M25572 Pain in left ankle and joints of left foot: Secondary | ICD-10-CM

## 2022-06-04 DIAGNOSIS — S8262XA Displaced fracture of lateral malleolus of left fibula, initial encounter for closed fracture: Secondary | ICD-10-CM

## 2022-06-04 DIAGNOSIS — S99912A Unspecified injury of left ankle, initial encounter: Secondary | ICD-10-CM | POA: Diagnosis not present

## 2022-06-04 NOTE — ED Provider Notes (Addendum)
[facility]    CSN: [id number] Arrival date & time: 06/04/22  1337      History   Chief Complaint Chief Complaint  Patient presents with   Ankle Pain    left    HPI Annette Monroe is a 77 y.o. female.   Patient presents for evaluation of left ankle pain and left knee pain beginning 1 day ago after rolling ankle externally off of the curb.,  Pain is experienced to the lateral aspect of the ankle and left leg.  Denies knee hitting the ground but endorses she extended extra pressure onto the leg when attempting to get yourself from falling.  Able to bear weight but pain is elicited.  Range of motion is intact but pain is elicited.  Experienced left calf cramping overnight and has attempted to treat pain with Tylenol.  Denies numbness or tingling.  Past Medical History:  Diagnosis Date   Abscessed tooth    Alopecia    Carpal tunnel syndrome of right wrist    Genital herpes    Heart murmur    History of esophagogastroduodenoscopy (EGD) 01/07/2021   Gastritis-Dr.Outlaw   Iritis 2004, 2007   Dr. Ricki Miller, and [facility]; negative work-up   Pneumonia age 28   pt was hospitalized at age 64   Unspecified vitamin D deficiency 2012   treated by Dr. Carren Rang initially, then here    Patient Active Problem List   Diagnosis Date Noted   Cardiac arrhythmia 09/01/2021   Chronic superficial gastritis without bleeding 09/01/2021   Colon cancer screening 09/01/2021   Constipation 09/01/2021   Gastroesophageal reflux disease 09/01/2021   Neck pain 09/01/2021   Peripheral neuropathy 09/01/2021   Epigastric pain 09/01/2021   Shoulder joint pain 09/01/2021   Scarring alopecia 09/11/2019   Bilateral sensorineural hearing loss 01/04/2017   Recurrent vertigo 01/04/2017   Subjective tinnitus of right ear 01/04/2017   Thoracic aorta atherosclerosis ([place]) 04/02/2015   Atherosclerosis of aorta ([place]) 08/22/2014   Allergic rhinitis 08/20/2013   Chest pain 07/12/2012    Palpitations 07/12/2012   Arthritis of right knee 07/12/2012   Genital herpes 02/08/2011   Vitamin D deficiency 02/08/2011   Carpal tunnel syndrome 02/08/2011    Past Surgical History:  Procedure Laterality Date   ABDOMINAL HYSTERECTOMY  2000   partial-ovaries remaining   APPENDECTOMY  1981   BREAST SURGERY  2004   breast reduction   DIAGNOSTIC LAPAROSCOPY  1999    OB History     Gravida  1   Para      Term      Preterm      AB  1   Living         SAB  1   IAB      Ectopic      Multiple      Live Births               Home Medications    Prior to Admission medications   Medication Sig Start Date End Date Taking? Authorizing Provider  Multiple Vitamins-Minerals (CENTRUM ULTRA WOMENS PO) Take 1 tablet by mouth daily.   Yes [provider]  rosuvastatin (CRESTOR) 10 MG tablet TAKE 1 TABLET(10 MG) BY MOUTH 2 TIMES A WEEK 02/25/22  Yes Rita Ohara, MD  Vitamin D, Ergocalciferol, (DRISDOL) 1.25 MG (50000 UNIT) CAPS capsule TAKE 1 CAPSULE BY MOUTH EVERY 14 DAYS 02/25/22  Yes Rita Ohara, MD  acetaminophen (TYLENOL) 500 MG tablet Take 500  mg by mouth every 6 (six) hours as needed. Patient not taking: Reported on 07/03/2020    [provider]  BIOTIN PO Take by mouth.     [provider]  clobetasol ointment (TEMOVATE) 5.36 % Apply 1 application topically 2 (two) times daily. Patient not taking: Reported on 02/25/2022    [provider]  famotidine (PEPCID) 20 MG tablet 1 tablet Patient not taking: Reported on 02/25/2022 10/13/20   [provider]  loratadine (CLARITIN) 10 MG tablet Take 10 mg by mouth daily. Reported on 08/25/2015 Patient not taking: Reported on 02/07/2020    [provider]  valACYclovir (VALTREX) 500 MG tablet 1 tablet Patient not taking: Reported on 02/25/2022    [provider]    Family History Family History  Problem Relation Age of Onset   Hypertension Mother    Diabetes Mother     Stroke Mother    Dementia Mother    Cancer Mother        leukemia (chronic)   Depression Mother    Diabetes Father    Hypertension Father    Parkinsonism Father    Hypertension Sister    COPD Sister    Heart disease Sister 57       (smoker)   Hypertension Brother    Stroke Brother    Hypertension Brother    Hyperlipidemia Brother    Hyperlipidemia Brother    Hypertension Brother     Social History Social History   Tobacco Use   Smoking status: Never   Smokeless tobacco: Never  Vaping Use   Vaping Use: Never used  Substance Use Topics   Alcohol use: No    Comment: rarely, maybe once a year.   Drug use: No     Allergies   Benadryl [diphenhydramine] and Finasteride   Review of Systems Review of Systems   Physical Exam Triage Vital Signs ED Triage Vitals  Enc Vitals Group     BP 06/04/22 1348 (!) 192/83     Pulse Rate 06/04/22 1348 70     Resp 06/04/22 1348 14     Temp 06/04/22 1348 98.3 F (36.8 C)     Temp Source 06/04/22 1348 Oral     SpO2 06/04/22 1348 98 %     Weight 06/04/22 1346 149 lb 11.1 oz (67.9 kg)     Height 06/04/22 1346 '5\' 7"'$  (1.702 m)     Head Circumference --      Peak Flow --      Pain Score 06/04/22 1346 7     Pain Loc --      Pain Edu? --      Excl. in [place]? --    No data found.  Updated Vital Signs BP (!) 192/83 (BP Location: Right Arm)   Pulse 70   Temp 98.3 F (36.8 C) (Oral)   Resp 14   Ht '5\' 7"'$  (1.702 m)   Wt 149 lb 11.1 oz (67.9 kg)   SpO2 98%   BMI 23.45 kg/m   Visual Acuity Right Eye Distance:   Left Eye Distance:   Bilateral Distance:    Right Eye Near:   Left Eye Near:    Bilateral Near:     Physical Exam Constitutional:      Appearance: Normal appearance.  Eyes:     Extraocular Movements: Extraocular movements intact.  Pulmonary:     Effort: Pulmonary effort is normal.  Musculoskeletal:     Comments: Tenderness and mild to moderate  swelling present over the lateral malleolus without ecchymosis or  deformity, able to bear weight, range of motion intact, sensation intact, 2 plus dorsalis pedis pulse  Neurological:     Mental Status: She is alert and oriented to person, place, and time. Mental status is at baseline.      UC Treatments / Results  Labs (all labs ordered are listed, but only abnormal results are displayed) Labs Reviewed - No data to display  EKG   Radiology No results found.  Procedures Procedures (including critical care time)  Medications Ordered in UC Medications - No data to display  Initial Impression / Assessment and Plan / UC Course  I have reviewed the triage vital signs and the nursing notes.  Pertinent labs & imaging results that were available during my care of the patient were reviewed by me and considered in my medical decision making (see chart for details).  Closed avulsion fracture of lateral malleolus of left fibula, initial encounter  Confirmed via x-ray, discussed findings with patient, cam boot applied by nursing staff, neurovascularly intact prior to and after placement, patient advised to wear at all times when completing activity, may remove at rest, recommend over-the-counter analgesics and ice for additional supportive care with orthopedic follow-up in 1 to 2 weeks for reevaluation, verbalized understanding of plan Final Clinical Impressions(s) / UC Diagnoses   Final diagnoses:  None   Discharge Instructions   None    ED Prescriptions   None    PDMP not reviewed this encounter.   Hans Eden, NP 06/04/22 1436    Hans Eden, NP 06/04/22 [phone]

## 2022-06-04 NOTE — ED Triage Notes (Addendum)
Patient states that she stepped on uneven pavement in a parking lot yesterday and rolled her left ankle.  Patient c/o pain in her left knee and left ankle.

## 2022-06-04 NOTE — Discharge Instructions (Signed)
X-ray confirms break in the bone over the area that has pain and swelling to your ankle  A boot has been applied to have stability and support, please wear whenever completing activity, may remove at rest,   When removed you may use ice over the affected area in 10 to 15-minute intervals to help reduce  You may continue Tylenol 500 to 1000 mg every 6 hours for management of pain, may also use ibuprofen 600 mg every 6 hours for additional comfort  Please schedule a follow-up appointment with orthopedics in 1 to 2 weeks for reevaluation and further management

## 2022-06-10 DIAGNOSIS — S93492A Sprain of other ligament of left ankle, initial encounter: Secondary | ICD-10-CM | POA: Diagnosis not present

## 2022-06-10 DIAGNOSIS — M25572 Pain in left ankle and joints of left foot: Secondary | ICD-10-CM | POA: Diagnosis not present

## 2022-06-10 DIAGNOSIS — S8265XA Nondisplaced fracture of lateral malleolus of left fibula, initial encounter for closed fracture: Secondary | ICD-10-CM | POA: Diagnosis not present

## 2022-06-28 DIAGNOSIS — Z1272 Encounter for screening for malignant neoplasm of vagina: Secondary | ICD-10-CM | POA: Diagnosis not present

## 2022-06-28 DIAGNOSIS — Z124 Encounter for screening for malignant neoplasm of cervix: Secondary | ICD-10-CM | POA: Diagnosis not present

## 2022-06-28 DIAGNOSIS — Z6824 Body mass index (BMI) 24.0-24.9, adult: Secondary | ICD-10-CM | POA: Diagnosis not present

## 2022-07-22 DIAGNOSIS — S93492A Sprain of other ligament of left ankle, initial encounter: Secondary | ICD-10-CM | POA: Diagnosis not present

## 2022-07-22 DIAGNOSIS — S8265XA Nondisplaced fracture of lateral malleolus of left fibula, initial encounter for closed fracture: Secondary | ICD-10-CM | POA: Diagnosis not present

## 2022-08-03 DIAGNOSIS — S8262XD Displaced fracture of lateral malleolus of left fibula, subsequent encounter for closed fracture with routine healing: Secondary | ICD-10-CM | POA: Diagnosis not present

## 2022-08-03 DIAGNOSIS — M25572 Pain in left ankle and joints of left foot: Secondary | ICD-10-CM | POA: Diagnosis not present

## 2022-09-07 DIAGNOSIS — S8262XD Displaced fracture of lateral malleolus of left fibula, subsequent encounter for closed fracture with routine healing: Secondary | ICD-10-CM | POA: Diagnosis not present

## 2022-09-07 DIAGNOSIS — S93492A Sprain of other ligament of left ankle, initial encounter: Secondary | ICD-10-CM | POA: Diagnosis not present

## 2022-09-21 ENCOUNTER — Ambulatory Visit (INDEPENDENT_AMBULATORY_CARE_PROVIDER_SITE_OTHER): Payer: Medicare Other

## 2022-09-21 ENCOUNTER — Encounter (HOSPITAL_COMMUNITY): Payer: Self-pay

## 2022-09-21 ENCOUNTER — Ambulatory Visit (HOSPITAL_COMMUNITY)
Admission: RE | Admit: 2022-09-21 | Discharge: 2022-09-21 | Disposition: A | Discharge status: Home / Self Care | Payer: Medicare Other | Source: Ambulatory Visit | Attending: Internal Medicine | Admitting: Internal Medicine

## 2022-09-21 VITALS — BP 167/78 | HR 81 | Temp 98.6°F | Resp 18

## 2022-09-21 DIAGNOSIS — U071 COVID-19: Secondary | ICD-10-CM

## 2022-09-21 DIAGNOSIS — R059 Cough, unspecified: Secondary | ICD-10-CM | POA: Diagnosis not present

## 2022-09-21 MED ORDER — BENZONATATE 200 MG PO CAPS: 200.0000 mg | ORAL_CAPSULE | Freq: Three times a day (TID) | ORAL | 0 refills | Status: DC | PRN

## 2022-09-21 MED ORDER — DOXYCYCLINE HYCLATE 100 MG PO CAPS: 100.0000 mg | ORAL_CAPSULE | Freq: Two times a day (BID) | ORAL | 0 refills | Status: DC

## 2022-09-21 MED ORDER — FLUTICASONE PROPIONATE 50 MCG/ACT NA SUSP: 2.0000 | Freq: Every day | NASAL | 0 refills | Status: DC

## 2022-09-21 NOTE — ED Triage Notes (Signed)
Pt reprots scratchy throat Thursday last week. Friday symptoms started getting worse, home test for Covid was negative. Then worse symptoms of fevers, hoarse voice and knew more than allergies, so retested and had positive Covid test on Saturday.  Last night coughing was so bad that had to prop up.  Taking Tylenol for body aches. Not taking medications for cough or congestion. But tried Vick's vapor rub last night.

## 2022-09-21 NOTE — Discharge Instructions (Addendum)
Your chest xray shows slight blunting of the corners of your lungs which it is not a typical look of viral covid pneumonia, but to make sure it will not become a pneumonia, Dr Marlinda Mike whom I consulted advised to place you on an antibiotic to cover bacterial pneumonia. Since you are past 5 day window for the antiviral medication and you are concerned about taking this medication, we will not prescribe it.  I have also sent something for your cough. Please follow up with your primary care doctor in 3 days, or go to ER if you get short of breath and worse. If you are able to get a oxygen monitor called pulse oxymetry, you may monitor your oxygen at home with it. If it drops to 92% or less, you will need to go to the ER.  The chest xray should be repeated in 6-8 weeks to make sure of resolution of the mild pleural effusion on the corners of your lungs

## 2022-09-21 NOTE — ED Provider Notes (Addendum)
[facility]    CSN: [id number] Arrival date & time: 09/21/22  1048      History   Chief Complaint Chief Complaint  Patient presents with   appt 1030   Covid Positive    HPI Annette Monroe is a 77 y.o. female who presents with ST, horsed, fever up to 100.2   cough x 4 days. Had negative Covid test 5 days ago with scratchy sore throat, then on day 3 she felt fatigue, and achy and the covid test that day was negative. It turned positive on day 4 Has had 3-4 Covid shots. Has never had covid before.  She visited a church last weekend and did not remember to wear her mask.  The cough is keeping her up at night time and had to sleep reclined.     Past Medical History:  Diagnosis Date   Abscessed tooth    Alopecia    Carpal tunnel syndrome of right wrist    Genital herpes    Heart murmur    History of esophagogastroduodenoscopy (EGD) 01/07/2021   Gastritis-Dr.Outlaw   Iritis 2004, 2007   Dr. Mitzi Davenport, and [facility]; negative work-up   Pneumonia age 54   pt was hospitalized at age 44   Unspecified vitamin D deficiency 2012   treated by Dr. Chevis Pretty initially, then here    Patient Active Problem List   Diagnosis Date Noted   Cardiac arrhythmia 09/01/2021   Chronic superficial gastritis without bleeding 09/01/2021   Colon cancer screening 09/01/2021   Constipation 09/01/2021   Gastroesophageal reflux disease 09/01/2021   Neck pain 09/01/2021   Peripheral neuropathy 09/01/2021   Epigastric pain 09/01/2021   Shoulder joint pain 09/01/2021   Scarring alopecia 09/11/2019   Bilateral sensorineural hearing loss 01/04/2017   Recurrent vertigo 01/04/2017   Subjective tinnitus of right ear 01/04/2017   Thoracic aorta atherosclerosis 04/02/2015   Atherosclerosis of aorta 08/22/2014   Allergic rhinitis 08/20/2013   Chest pain 07/12/2012   Palpitations 07/12/2012   Arthritis of right knee 07/12/2012   Genital herpes 02/08/2011   Vitamin D deficiency  02/08/2011   Carpal tunnel syndrome 02/08/2011    Past Surgical History:  Procedure Laterality Date   ABDOMINAL HYSTERECTOMY  2000   partial-ovaries remaining   APPENDECTOMY  1981   BREAST SURGERY  2004   breast reduction   DIAGNOSTIC LAPAROSCOPY  1999    OB History     Gravida  1   Para      Term      Preterm      AB  1   Living         SAB  1   IAB      Ectopic      Multiple      Live Births               Home Medications    Prior to Admission medications   Medication Sig Start Date End Date Taking? Authorizing Provider  benzonatate (TESSALON) 200 MG capsule Take 1 capsule (200 mg total) by mouth 3 (three) times daily as needed for cough. 09/21/22  Yes Rodriguez-Southworth, Nettie Elm, PA-C  doxycycline (VIBRAMYCIN) 100 MG capsule Take 1 capsule (100 mg total) by mouth 2 (two) times daily. 09/21/22  Yes Rodriguez-Southworth, Nettie Elm, PA-C  fluticasone (FLONASE) 50 MCG/ACT nasal spray Place 2 sprays into both nostrils daily. 09/21/22  Yes Rodriguez-Southworth, Nettie Elm, PA-C  BIOTIN PO Take by mouth.     [provider]  Multiple Vitamins-Minerals (CENTRUM ULTRA WOMENS PO) Take 1 tablet by mouth daily.    [provider]  rosuvastatin (CRESTOR) 10 MG tablet TAKE 1 TABLET(10 MG) BY MOUTH 2 TIMES A WEEK 02/25/22   Joselyn Arrow, MD  Vitamin D, Ergocalciferol, (DRISDOL) 1.25 MG (50000 UNIT) CAPS capsule TAKE 1 CAPSULE BY MOUTH EVERY 14 DAYS 02/25/22   Joselyn Arrow, MD    Family History Family History  Problem Relation Age of Onset   Hypertension Mother    Diabetes Mother    Stroke Mother    Dementia Mother    Cancer Mother        leukemia (chronic)   Depression Mother    Diabetes Father    Hypertension Father    Parkinsonism Father    Hypertension Sister    COPD Sister    Heart disease Sister 21       (smoker)   Hypertension Brother    Stroke Brother    Hypertension Brother    Hyperlipidemia Brother    Hyperlipidemia Brother     Hypertension Brother     Social History Social History   Tobacco Use   Smoking status: Never   Smokeless tobacco: Never  Vaping Use   Vaping Use: Never used  Substance Use Topics   Alcohol use: No    Comment: rarely, maybe once a year.   Drug use: No     Allergies   Benadryl [diphenhydramine] and Finasteride   Review of Systems Review of Systems  Constitutional:  Positive for appetite change, chills, fatigue and fever. Negative for diaphoresis.  HENT:  Positive for congestion, postnasal drip and sore throat. Negative for ear discharge, ear pain and trouble swallowing.   Eyes:  Negative for discharge.  Respiratory:  Positive for cough. Negative for shortness of breath.        Chest soreness provoked with cough  Cardiovascular:  Negative for chest pain and leg swelling.  Musculoskeletal:  Positive for myalgias.  Neurological:  Positive for headaches.  Hematological:  Negative for adenopathy.     Physical Exam Triage Vital Signs ED Triage Vitals  Enc Vitals Group     BP 09/21/22 1122 (!) 167/78     Pulse Rate 09/21/22 1122 81     Resp 09/21/22 1122 18     Temp 09/21/22 1122 98.6 F (37 C)     Temp Source 09/21/22 1122 Oral     SpO2 09/21/22 1122 97 %     Weight --      Height --      Head Circumference --      Peak Flow --      Pain Score 09/21/22 1121 6     Pain Loc --      Pain Edu? --      Excl. in [place]? --    No data found.  Updated Vital Signs BP (!) 167/78 (BP Location: Right Arm)   Pulse 81   Temp 98.6 F (37 C) (Oral)   Resp 18   SpO2 97%   Visual Acuity Right Eye Distance:   Left Eye Distance:   Bilateral Distance:    Right Eye Near:   Left Eye Near:    Bilateral Near:     Physical Exam Physical Exam Vitals signs and nursing note reviewed.  Constitutional:      General: She is not in acute distress.    Appearance: Normal appearance. She is not ill-appearing, toxic-appearing or diaphoretic.  HENT:  Head: Normocephalic.     Right  Ear: Tympanic membrane, ear canal and external ear normal.     Left Ear: Tympanic membrane, ear canal and external ear normal.     Nose: Nose normal.     Mouth/Throat:     Mouth: Mucous membranes are moist.  Eyes:     General: No scleral icterus.       Right eye: No discharge.        Left eye: No discharge.     Conjunctiva/sclera: Conjunctivae normal.  Neck:     Musculoskeletal: Neck supple. No neck rigidity.  Cardiovascular:     Rate and Rhythm: Normal rate and regular rhythm.     Heart sounds: No murmur.  Pulmonary:     Effort: Pulmonary effort is normal.     Breath sounds: Normal breath sounds. Has L sternal border chest wall tenderness which reproduced the type pain she has been having  Musculoskeletal: Normal range of motion.  Lymphadenopathy:     Cervical: No cervical adenopathy.  Skin:    General: Skin is warm and dry.     Coloration: Skin is not jaundiced.     Findings: No rash.  Neurological:     Mental Status: She is alert and oriented to person, place, and time.     Gait: Gait normal.  Psychiatric:        Mood and Affect: Mood normal.        Behavior: Behavior normal.        Thought Content: Thought content normal.        Judgment: Judgment normal.    UC Treatments / Results  Labs (all labs ordered are listed, but only abnormal results are displayed) Labs Reviewed - No data to display  EKG   Radiology DG Chest 2 View  Result Date: 09/21/2022 CLINICAL DATA:  Cough, scratchy throat EXAM: CHEST - 2 VIEW COMPARISON:  08/22/2014 FINDINGS: Cardiac and mediastinal contours are within normal limits. No focal pulmonary opacity. Blunting of the costophrenic angles, which could represent trace bilateral pleural effusions or pleural thickening. No pneumothorax. No acute osseous abnormality. IMPRESSION: Blunting of the costophrenic angles, which could represent trace bilateral pleural effusions or pleural thickening. Electronically Signed   By: Wiliam Ke M.D.   On:  09/21/2022 12:25    Procedures Procedures (including critical care time)  Medications Ordered in UC Medications - No data to display  Initial Impression / Assessment and Plan / UC Course  I have reviewed the triage vital signs and the nursing notes.  Pertinent  imaging results that were available during my care of the patient were reviewed by me and considered in my medical decision making (see chart for details).  Covid infection Mild pleural effusion Since she is past the 5 day window to benefit from the antiviral medicaiton and pt is hesitant to even take it, she was explained she will not be prescribed this. After consulting with Dr Marlinda Mike, she advised to place her on Doxy to cover for pneumonia, and was prescribed as noted I also placed her on Tessalon  and Flonase as noted. See instructions.    Final Clinical Impressions(s) / UC Diagnoses   Final diagnoses:  COVID-19 virus infection     Discharge Instructions      Your chest xray shows slight blunting of the corners of your lungs which it is not a typical look of viral covid pneumonia, but to make sure it will not become a pneumonia, Dr Marlinda Mike whom I  consulted advised to place you on an antibiotic to cover bacterial pneumonia. Since you are past 5 day window for the antiviral medication and you are concerned about taking this medication, we will not prescribe it.  I have also sent something for your cough. Please follow up with your primary care doctor in 3 days, or go to ER if you get short of breath and worse. If you are able to get a oxygen monitor called pulse oxymetry, you may monitor your oxygen at home with it. If it drops to 92% or less, you will need to go to the ER.  The chest xray should be repeated in 6-8 weeks to make sure of resolution of the mild pleural effusion on the corners of your lungs        ED Prescriptions     Medication Sig Dispense Auth. Provider   benzonatate (TESSALON) 200 MG capsule  Take 1 capsule (200 mg total) by mouth 3 (three) times daily as needed for cough. 30 capsule Rodriguez-Southworth, Nettie Elm, PA-C   doxycycline (VIBRAMYCIN) 100 MG capsule Take 1 capsule (100 mg total) by mouth 2 (two) times daily. 20 capsule Rodriguez-Southworth, Donyale Falcon, PA-C   fluticasone (FLONASE) 50 MCG/ACT nasal spray Place 2 sprays into both nostrils daily. 18.2 g Rodriguez-Southworth, Nettie Elm, PA-C      PDMP not reviewed this encounter.   Garey Ham, PA-C 09/21/22 1253    Rodriguez-Southworth, [place], PA-C 09/21/22 1254    Rodriguez-Southworth, Nettie Elm, PA-C 09/21/22 [phone]

## 2022-12-23 ENCOUNTER — Ambulatory Visit: Payer: Medicare Other | Admitting: Family Medicine

## 2023-01-05 DIAGNOSIS — M1711 Unilateral primary osteoarthritis, right knee: Secondary | ICD-10-CM | POA: Diagnosis not present

## 2023-01-27 ENCOUNTER — Other Ambulatory Visit: Payer: Self-pay

## 2023-01-27 ENCOUNTER — Ambulatory Visit
Admission: EM | Admit: 2023-01-27 | Discharge: 2023-01-27 | Disposition: A | Discharge status: Home / Self Care | Payer: Medicare Other | Attending: Internal Medicine | Admitting: Internal Medicine

## 2023-01-27 DIAGNOSIS — Z1152 Encounter for screening for COVID-19: Secondary | ICD-10-CM | POA: Diagnosis not present

## 2023-01-27 DIAGNOSIS — R051 Acute cough: Secondary | ICD-10-CM | POA: Diagnosis not present

## 2023-01-27 DIAGNOSIS — J069 Acute upper respiratory infection, unspecified: Secondary | ICD-10-CM | POA: Insufficient documentation

## 2023-01-27 LAB — POCT RAPID STREP A (OFFICE): Rapid Strep A Screen: NEGATIVE

## 2023-01-27 NOTE — ED Triage Notes (Signed)
Pt presents with c/o sore throat X 3 days. States she has had headaches, body aches and congestion.   States she took a covid test at home. It was negative.

## 2023-01-27 NOTE — Discharge Instructions (Signed)
The clinic will contact you with results of the COVID test done today if positive.  You may use over-the-counter cough medicine as needed.  Lots of rest and fluids.  Please follow-up with your PCP in 2 days for recheck.  Please go to the emergency room for any worsening symptoms.  I hope you feel better soon!

## 2023-01-27 NOTE — ED Provider Notes (Signed)
[facility]    CSN: [id number] Arrival date & time: 01/27/23  1253      History   Chief Complaint Chief Complaint  Patient presents with   Sore Throat   Fever    HPI Annette Monroe is a 77 y.o. female  presents for evaluation of URI symptoms for 3 days. Patient reports associated symptoms of sore throat, cough, congestions, headache, body aches, low-grade fevers. Denies N/V/D, shortness of breath or ear pain. Patient does not have a hx of asthma or smoking. No known sick contacts but she did recently travel on a plane.  Pt has taken Tylenol OTC for symptoms.  Reports a negative home COVID test.  Pt has no other concerns at this time.    Sore Throat  Fever Associated symptoms: congestion, cough, myalgias and sore throat     Past Medical History:  Diagnosis Date   Abscessed tooth    Alopecia    Carpal tunnel syndrome of right wrist    Genital herpes    Heart murmur    History of esophagogastroduodenoscopy (EGD) 01/07/2021   Gastritis-Dr.Outlaw   Iritis 2004, 2007   Dr. Mitzi Davenport, and [facility]; negative work-up   Pneumonia age 68   pt was hospitalized at age 23   Unspecified vitamin D deficiency 2012   treated by Dr. Chevis Pretty initially, then here    Patient Active Problem List   Diagnosis Date Noted   Cardiac arrhythmia 09/01/2021   Chronic superficial gastritis without bleeding 09/01/2021   Colon cancer screening 09/01/2021   Constipation 09/01/2021   Gastroesophageal reflux disease 09/01/2021   Neck pain 09/01/2021   Peripheral neuropathy 09/01/2021   Epigastric pain 09/01/2021   Shoulder joint pain 09/01/2021   Scarring alopecia 09/11/2019   Bilateral sensorineural hearing loss 01/04/2017   Recurrent vertigo 01/04/2017   Subjective tinnitus of right ear 01/04/2017   Thoracic aorta atherosclerosis (HCC) 04/02/2015   Atherosclerosis of aorta (HCC) 08/22/2014   Allergic rhinitis 08/20/2013   Chest pain 07/12/2012   Palpitations  07/12/2012   Arthritis of right knee 07/12/2012   Genital herpes 02/08/2011   Vitamin D deficiency 02/08/2011   Carpal tunnel syndrome 02/08/2011    Past Surgical History:  Procedure Laterality Date   ABDOMINAL HYSTERECTOMY  2000   partial-ovaries remaining   APPENDECTOMY  1981   BREAST SURGERY  2004   breast reduction   DIAGNOSTIC LAPAROSCOPY  1999    OB History     Gravida  1   Para      Term      Preterm      AB  1   Living         SAB  1   IAB      Ectopic      Multiple      Live Births               Home Medications    Prior to Admission medications   Medication Sig Start Date End Date Taking? Authorizing Provider  benzonatate (TESSALON) 200 MG capsule Take 1 capsule (200 mg total) by mouth 3 (three) times daily as needed for cough. 09/21/22   Rodriguez-Southworth, Nettie Elm, PA-C  BIOTIN PO Take by mouth.     [provider]  doxycycline (VIBRAMYCIN) 100 MG capsule Take 1 capsule (100 mg total) by mouth 2 (two) times daily. 09/21/22   Rodriguez-Southworth, Nettie Elm, PA-C  fluticasone (FLONASE) 50 MCG/ACT nasal spray Place 2 sprays into both nostrils daily.  09/21/22   Rodriguez-Southworth, Nettie Elm, PA-C  Multiple Vitamins-Minerals (CENTRUM ULTRA WOMENS PO) Take 1 tablet by mouth daily.    [provider]  rosuvastatin (CRESTOR) 10 MG tablet TAKE 1 TABLET(10 MG) BY MOUTH 2 TIMES A WEEK 02/25/22   Joselyn Arrow, MD  Vitamin D, Ergocalciferol, (DRISDOL) 1.25 MG (50000 UNIT) CAPS capsule TAKE 1 CAPSULE BY MOUTH EVERY 14 DAYS 02/25/22   Joselyn Arrow, MD    Family History Family History  Problem Relation Age of Onset   Hypertension Mother    Diabetes Mother    Stroke Mother    Dementia Mother    Cancer Mother        leukemia (chronic)   Depression Mother    Diabetes Father    Hypertension Father    Parkinsonism Father    Hypertension Sister    COPD Sister    Heart disease Sister 69       (smoker)   Hypertension Brother    Stroke Brother     Hypertension Brother    Hyperlipidemia Brother    Hyperlipidemia Brother    Hypertension Brother     Social History Social History   Tobacco Use   Smoking status: Never   Smokeless tobacco: Never  Vaping Use   Vaping status: Never Used  Substance Use Topics   Alcohol use: No    Comment: rarely, maybe once a year.   Drug use: No     Allergies   Benadryl [diphenhydramine] and Finasteride   Review of Systems Review of Systems  Constitutional:  Positive for fever.  HENT:  Positive for congestion and sore throat.   Respiratory:  Positive for cough.   Musculoskeletal:  Positive for myalgias.     Physical Exam Triage Vital Signs ED Triage Vitals  Encounter Vitals Group     BP 01/27/23 1304 (!) 167/93     Systolic BP Percentile --      Diastolic BP Percentile --      Pulse Rate 01/27/23 1304 74     Resp 01/27/23 1304 18     Temp 01/27/23 1304 98.2 F (36.8 C)     Temp Source 01/27/23 1304 Oral     SpO2 01/27/23 1304 97 %     Weight --      Height --      Head Circumference --      Peak Flow --      Pain Score 01/27/23 1303 3     Pain Loc --      Pain Education --      Exclude from Growth Chart --    No data found.  Updated Vital Signs BP (!) 167/93 (BP Location: Right Arm)   Pulse 74   Temp 98.2 F (36.8 C) (Oral)   Resp 18   SpO2 97%   Visual Acuity Right Eye Distance:   Left Eye Distance:   Bilateral Distance:    Right Eye Near:   Left Eye Near:    Bilateral Near:     Physical Exam Vitals and nursing note reviewed.  Constitutional:      General: She is not in acute distress.    Appearance: She is well-developed. She is not ill-appearing.  HENT:     Head: Normocephalic and atraumatic.     Right Ear: Tympanic membrane and ear canal normal.     Left Ear: Tympanic membrane and ear canal normal.     Nose: Congestion present.     Mouth/Throat:  Mouth: Mucous membranes are moist.     Pharynx: Oropharynx is clear. Uvula midline.  Posterior oropharyngeal erythema present.     Tonsils: No tonsillar exudate or tonsillar abscesses.  Eyes:     Conjunctiva/sclera: Conjunctivae normal.     Pupils: Pupils are equal, round, and reactive to light.  Cardiovascular:     Rate and Rhythm: Normal rate and regular rhythm.     Heart sounds: Normal heart sounds.  Pulmonary:     Effort: Pulmonary effort is normal.     Breath sounds: Normal breath sounds.  Musculoskeletal:     Cervical back: Normal range of motion and neck supple.  Lymphadenopathy:     Cervical: No cervical adenopathy.  Skin:    General: Skin is warm and dry.  Neurological:     General: No focal deficit present.     Mental Status: She is alert and oriented to person, place, and time.  Psychiatric:        Mood and Affect: Mood normal.        Behavior: Behavior normal.      UC Treatments / Results  Labs (all labs ordered are listed, but only abnormal results are displayed) Labs Reviewed  SARS CORONAVIRUS 2 (TAT 6-24 HRS)  POCT RAPID STREP A (OFFICE)    EKG   Radiology No results found.  Procedures Procedures (including critical care time)  Medications Ordered in UC Medications - No data to display  Initial Impression / Assessment and Plan / UC Course  I have reviewed the triage vital signs and the nursing notes.  Pertinent labs & imaging results that were available during my care of the patient were reviewed by me and considered in my medical decision making (see chart for details).     Reviewed exam and symptoms with patient.  No red flags.  COVID P urine will contact if positive.  Negative rapid strep.  Discussed viral illness and symptomatic treatment.  PCP follow-up 2 days for recheck.  ER precautions reviewed and patient verbalized understanding. Final Clinical Impressions(s) / UC Diagnoses   Final diagnoses:  Acute cough  Viral upper respiratory illness     Discharge Instructions      The clinic will contact you with results  of the COVID test done today if positive.  You may use over-the-counter cough medicine as needed.  Lots of rest and fluids.  Please follow-up with your PCP in 2 days for recheck.  Please go to the emergency room for any worsening symptoms.  I hope you feel better soon!    ED Prescriptions   None    PDMP not reviewed this encounter.   Radford Pax, NP 01/27/23 1322

## 2023-01-28 LAB — SARS CORONAVIRUS 2 (TAT 6-24 HRS): SARS Coronavirus 2: NEGATIVE

## 2023-02-06 ENCOUNTER — Other Ambulatory Visit: Payer: Self-pay | Admitting: Family Medicine

## 2023-02-06 DIAGNOSIS — E7800 Pure hypercholesterolemia, unspecified: Secondary | ICD-10-CM

## 2023-02-06 DIAGNOSIS — I700 Atherosclerosis of aorta: Secondary | ICD-10-CM

## 2023-02-09 NOTE — Progress Notes (Unsigned)
No chief complaint on file.   She was seen in UC on 8/29 with URI symptoms for 3 days-- sore throat, cough, congestion, headache, body aches, low-grade fevers.  She had negative COVID and strep test.   PMH, PSH, SH reviewed   ROS:    PHYSICAL EXAM:  There were no vitals taken for this visit.      ASSESSMENT/PLAN:

## 2023-02-10 ENCOUNTER — Encounter: Payer: Self-pay | Admitting: Family Medicine

## 2023-02-10 ENCOUNTER — Ambulatory Visit (INDEPENDENT_AMBULATORY_CARE_PROVIDER_SITE_OTHER): Payer: Medicare Other | Admitting: Family Medicine

## 2023-02-10 VITALS — BP 130/70 | HR 80 | Temp 97.8°F | Ht 67.0 in | Wt 157.8 lb

## 2023-02-10 DIAGNOSIS — Z7185 Encounter for immunization safety counseling: Secondary | ICD-10-CM | POA: Diagnosis not present

## 2023-02-10 DIAGNOSIS — J069 Acute upper respiratory infection, unspecified: Secondary | ICD-10-CM

## 2023-02-10 NOTE — Patient Instructions (Signed)
Please get your COVID vaccine as soon as you feel like you are better.  I recommend getting it sooner rather than later, so that it has time to work before your trip--ie schedule a nurse visit for early next week for the COVID (and flu, but if you prefer to wait on your flu shot, that's fine).

## 2023-02-14 ENCOUNTER — Other Ambulatory Visit (INDEPENDENT_AMBULATORY_CARE_PROVIDER_SITE_OTHER): Payer: Medicare Other

## 2023-02-14 DIAGNOSIS — Z23 Encounter for immunization: Secondary | ICD-10-CM

## 2023-02-18 DIAGNOSIS — H35033 Hypertensive retinopathy, bilateral: Secondary | ICD-10-CM | POA: Diagnosis not present

## 2023-02-18 DIAGNOSIS — H25813 Combined forms of age-related cataract, bilateral: Secondary | ICD-10-CM | POA: Diagnosis not present

## 2023-02-18 DIAGNOSIS — H40013 Open angle with borderline findings, low risk, bilateral: Secondary | ICD-10-CM | POA: Diagnosis not present

## 2023-02-18 DIAGNOSIS — H11153 Pinguecula, bilateral: Secondary | ICD-10-CM | POA: Diagnosis not present

## 2023-02-21 ENCOUNTER — Encounter: Payer: Self-pay | Admitting: Family Medicine

## 2023-03-22 NOTE — Patient Instructions (Incomplete)
HEALTH MAINTENANCE RECOMMENDATIONS:  It is recommended that you get at least 30 minutes of aerobic exercise at least 5 days/week (for weight loss, you may need as much as 60-90 minutes). This can be any activity that gets your heart rate up. This can be divided in 10-15 minute intervals if needed, but try and build up your endurance at least once a week.  Weight bearing exercise is also recommended twice weekly.  Eat a healthy diet with lots of vegetables, fruits and fiber.  "Colorful" foods have a lot of vitamins (ie green vegetables, tomatoes, red peppers, etc).  Limit sweet tea, regular sodas and alcoholic beverages, all of which has a lot of calories and sugar.  Up to 1 alcoholic drink daily may be beneficial for women (unless trying to lose weight, watch sugars).  Drink a lot of water.  Calcium recommendations are 1200-1500 mg daily (1500 mg for postmenopausal women or women without ovaries), and vitamin D 1000 IU daily.  This should be obtained from diet and/or supplements (vitamins), and calcium should not be taken all at once, but in divided doses.  Monthly self breast exams and yearly mammograms for women over the age of 81 is recommended.  Sunscreen of at least SPF 30 should be used on all sun-exposed parts of the skin when outside between the hours of 10 am and 4 pm (not just when at beach or pool, but even with exercise, golf, tennis, and yard work!)  Use a sunscreen that says "broad spectrum" so it covers both UVA and UVB rays, and make sure to reapply every 1-2 hours.  Remember to change the batteries in your smoke detectors when changing your clock times in the spring and fall. Carbon monoxide detectors are recommended for your home.  Use your seat belt every time you are in a car, and please drive safely and not be distracted with cell phones and texting while driving.   Ms. Kozuch , Thank you for taking time to come for your Medicare Wellness Visit. I appreciate your ongoing  commitment to your health goals. Please review the following plan we discussed and let me know if I can assist you in the future.   This is a list of the screening recommended for you and due dates:  Health Maintenance  Topic Date Due   Zoster (Shingles) Vaccine (1 of 2) 03/17/1998   DTaP/Tdap/Td vaccine (3 - Td or Tdap) 02/07/2021   Flu Shot  12/30/2022   Mammogram  05/04/2023   Medicare Annual Wellness Visit  03/22/2024   Colon Cancer Screening  03/27/2028   Pneumonia Vaccine  Completed   DEXA scan (bone density measurement)  Completed   COVID-19 Vaccine  Completed   Hepatitis C Screening  Completed   HPV Vaccine  Aged Out   I recommend getting another bone density test. Please schedule this through [facility].  RSV vaccine is recommended this fall--wait 2 weeks from today's vaccines and get from the pharmacy.  You are past due to get your tetanus booster (TdaP).  Please get this from the pharmacy (either same day as RSV vaccine, or 2 weeks apart). This is recommended every 10 years.  I recommend getting the new shingles vaccine (Shingrix). Since you have Medicare, you will need to get this from the pharmacy, as it is covered by Part D. This is a series of 2 injections, spaced 2 months apart.   This should be separated from other vaccines by at least 2 weeks.  We discussed  the recommendation to get Prevnar-20 (the newest pneumonia shot--you will not need any further pneumonia vaccines once you get this). You can get this one from our office or from the pharmacy.  Check your BP a few times per month. Be sure to relax a few minutes prior to checking it. If it is >140 systolic, you probably should recheck it 5 minutes later (after relaxing). Record both numbers. Continue to record comments, to also include pain scores (ie 7/10 pain), as well as stress, poor sleep, sodium intake etc.  Consider taking famotidine (pepcid) before dinner. Try decaff coffee in the morning (vs taking pepcid  twice daily). You may continue to use Tums, if needed.  Pay attention to whether you have more symptoms on the days you take the crestor. You can switch it to bedtime dosing, if needed. If you want to, you can try skipping the crestor for 2-4 weeks to see if you notice any difference.

## 2023-03-22 NOTE — Progress Notes (Unsigned)
No chief complaint on file.   Annette Monroe is a 77 y.o. female who presents for annual wellness visit and follow-up on chronic medical conditions.  She is under the care of GYN and saw Dr. Rana Snare 05/2022.  She rolled her ankle off of a curb on 06/03/22 and had a closed avulsion fracture of lateral malleolus of left fibula. She has seen Dr. Odis Hollingshead for follow-up.   H/o GERD:  This was evaluated/treated by [facility]. She had unremarkable CT of abdomen in 07/2020 (colonic diverticulosis, aortic atherosclerosis noted). PPI was prescribed, and she underwent EGD in 12/2020 which showed gastritis, biopsy was negative for H.pylori.  She no longer takes any PPI. She still has some occasional burning in her upper stomach, related to eating.  Hasn't found a trigger. Denies dysphagia or abdominal pain. Denies "heartburn" (nothing in her chest).  Elevated blood pressures: She didn't tolerate HCTZ in the past (due to heaviness in her chest, and a "fluttering awareness").  She continues to try and limit the sodium in her diet.  BP's are checked regularly at home-- Running   Denies headaches, dizziness, chest pain or shortness of breath.  BP Readings from Last 3 Encounters:  02/10/23 130/70  01/27/23 (!) 167/93  09/21/22 (!) 167/78   H/o occipital headaches/neck pain.  C-spine films showed disc osteophytic disease at C3-C4 and C5-C6. She continues to use a stand for her iPad to help limit looking down, and her headaches and neck pain remain improved.  No longer does stretches. Her morning occipital discomfort improved after getting a new pillow.  History of vitamin D deficiency: Level was slightly low at 29.8 in 01/2020 when taking 50,000 IU of vitamin D once a month (on the 15th of the month). This was changed to taking it every 2 weeks. Recheck in 07/2020 was 45.9. She switches to once a month through the summer months. Last level was 41.5 in 01/2021 on this regimen (had taken it monthly over the  summer).  Currently taking it once a month (switches in October to q2 weeks). ***UPDATE  Hyperlipidemia/atherosclerosis.  She was started on Crestor in November 2021 for atherosclerosis (thoracic aorta, seen on CXR 07/2014) . ASCVD risk was 13.5%. Coronary calcium 01/2020 was 0, but calcifications were noted in aortic root and descending thoracic aorta. She is taking Crestor 10mg  twice a week, and tolerating it. She denies myalgias. She continues to try and follow a low cholesterol diet (doesn't eat red meat, uses vinaigrette dressings, uses 2% string cheese as a snack, eating more egg whites).  Lipids have been at goal, due for recheck. Lab Results  Component Value Date   CHOL 162 02/25/2022   HDL 64 02/25/2022   LDLCALC 82 02/25/2022   TRIG 84 02/25/2022   CHOLHDL 2.5 02/25/2022    Immunization History  Administered Date(s) Administered   Fluad Quad(high Dose 65+) 02/12/2021, 02/25/2022   Influenza Split 03/01/2011, 02/15/2012, 03/05/2019   Influenza Whole 03/28/2007   Influenza, High Dose Seasonal PF 03/19/2013, 02/28/2014   Influenza,inj,Quad PF,6+ Mos 03/10/2015, 03/03/2016, 03/02/2017, 03/29/2018, 03/26/2020   PFIZER Comirnaty(Gray Top)Covid-19 Tri-Sucrose Vaccine 10/07/2020   PFIZER(Purple Top)SARS-COV-2 Vaccination 07/05/2019, 07/30/2019, 02/23/2020   Pfizer Covid-19 Vaccine Bivalent Booster 35yrs & up 03/09/2021   Pfizer(Comirnaty)Fall Seasonal Vaccine 12 years and older 04/05/2022, 02/14/2023   Pneumococcal Conjugate-13 08/20/2013   Pneumococcal Polysaccharide-23 08/22/2014   Td 07/05/1995   Tdap 02/08/2011   Zoster, Live 01/04/2013   Last Pap smear: 09/2017, Dr. Rana Snare, normal. S/p hysterectomy. Sees GYN  yearly, last 05/2022. Last mammogram: 04/2022 Last colonoscopy: 02/2018 internal hemorrhoids and diverticulosis (Dr. Dulce Sellar). DEXA: 01/2014, normal, done through Dr. Corwin Levins office Dentist: twice yearly and periodontist twice yearly.  Ophtho: once yearly.  Exercise:  "not  very good". In Spring/Summer she walked at [facility], cut back when it got too hot.  She did DVD's indoors instead, but got out of the routine, exercising only once every 2-3 weeks in Aug/September. Hasn't been to the gym since COVID.     Patient Care Team: Joselyn Arrow, MD as PCP - General (Family Medicine) Dimitri Ped, MD as Consulting Physician (Surgery) Dr. Dannielle Burn, DDS as Consulting Physician (Dentistry) Candice Camp, MD as Consulting Physician (Obstetrics and Gynecology) Juluis Pitch, DDS as Consulting Physician (Dentistry) Willis Modena, MD as Consulting Physician (Gastroenterology) Basilio Cairo, MD as Consulting Physician (Dermatology) Christia Reading, MD as Consulting Physician (Otolaryngology) Now sees Dr. Zenaida Niece (Dr. Alben Spittle moved, can be removed from list of docs) Dr. Odis Hollingshead (ortho, for ankle fracture 05/2022)  Depression Screening: Flowsheet Row Office Visit from 02/25/2022 in [facility]  PHQ-2 Total Score 0       Falls screen:     02/25/2022    9:43 AM 02/12/2021    8:34 AM 02/07/2020    8:46 AM 04/25/2019   11:35 AM 01/29/2019    1:48 PM  Fall Risk   Falls in the past year? 0 0 0 0 0  Comment    Emmi Telephone Survey: data to providers prior to load   Number falls in past yr: 0 0     Injury with Fall? 0 0     Risk for fall due to :  No Fall Risks     Follow up  Falls evaluation completed       Functional Status Survey:         End of Life Discussion:  Patient has a living will and medical power of attorney. We do not have copies. She recently updated these.  PMH, PSH, SH and FH were reviewed     ROS: The patient denies anorexia, fever,  headaches, vision changes, ear pain, sore throat, breast concerns,syncope, dyspnea on exertion, cough, swelling, nausea, vomiting, diarrhea, constipation, abdominal pain, melena, hematochezia, indigestion/heartburn, hematuria, incontinence, dysuria, vaginal bleeding or discharge, odor or itch,  weakness, tremor, suspicious skin lesions, depression, anxiety, abnormal bleeding/bruising, or enlarged lymph nodes.   genital herpes outbreaks, infrequent. Chronic tinnitus, no true vertigo, slight weird sensation as described above.  Seasonal allergies Alopecia, no longer sees Dr. Darreld Mclean. Got an expensive wig, doesn't like it (too much hair), not wearing. Skin tag below bra line occasionally gets irritated (on left side).   PHYSICAL EXAM:  There were no vitals taken for this visit.  Wt Readings from Last 3 Encounters:  02/10/23 157 lb 12.8 oz (71.6 kg)  06/04/22 149 lb 11.1 oz (67.9 kg)  02/25/22 149 lb 9.6 oz (67.9 kg)    General Appearance:    Alert, cooperative, no distress, appears stated age  Head:    Normocephalic, without obvious abnormality, atraumatic     Eyes:    Pupils are equal, round, reactive; conjunctiva/corneas clear, EOM's intact; fundi not well visualized today  Ears:    Normal TM's and external ear canals.   Nose:    Normal mucosa, no significant edema, no mucus, no sinus tenderness   Throat:    Normal mucosa, no lesions  Neck:    Supple, no lymphadenopathy; thyroid: no enlargement/ tenderness/nodules; no  carotid bruit or JVD     Back:    Spine nontender, no curvature, ROM normal, no CVA tenderness   Lungs:    Clear to auscultation bilaterally without wheezes, rales or ronchi; respirations unlabored     Chest Wall:    No tenderness or deformity     Heart:    Regular rate and rhythm, S1 and S2 normal, no murmur, rub or gallop     Breast Exam:    Deferred to GYN     Abdomen:    Soft, non-tender, nondistended, normoactive bowel sounds, no masses, no hepatosplenomegaly     Genitalia:    Deferred to GYN            Extremities:    No clubbing, cyanosis or edema.  Pulses:    2+ and symmetric all extremities.  Skin:    Skin color, texture, turgor normal, no rashes or lesions. She has diffuse, significant thinning of hair on top of her head. Scalp is normal (no  inflammation, flaking). Curvy shaped mole just to the right of thoracic spine, uniform color, unchanged (compared to photo on her phone from 2019). Very small skin tag under L breast, not currently irritated.  Pt in gown for waist up exam, wearing pants, legs not examined.   Lymph nodes:    Cervical, supraclavicular nodes normal     Neurologic:    Normal strength, sensation and gait; reflexes 2+ and symmetric throughout                       Psych:   Normal mood, affect, hygiene and grooming  ***UPDATE SKIN  ASSESSMENT/PLAN:   Flu shot today. Also can give prevnar-20 (if willing) Did she get RSV, shingrix or TdaP from pharmacy?? Past due for Tdap  DEXA rec (has been 9 years, had ankle fx and h/o D deficiency) To get from [facility] (where she gets mammo)  Remind to get [place] copies of LW/HCPOA  Discussed monthly self breast exams and yearly mammograms; at least 30 minutes of aerobic activity at least 5 days/week, weight bearing exercise at least 2x/wk; proper sunscreen use reviewed; healthy diet, including goals of calcium and vitamin D intake and alcohol recommendations (less than or equal to 1 drink/day) reviewed; regular seatbelt use; changing batteries in smoke detectors. Immunization recommendations discussed-- Continue yearly flu shots, given today.  Prevnar-20 RSV vaccine recommended, to get from pharmacy in 2 weeks. Tdap booster is past due now, to get from pharmacy.  Shingrix recommended, risks/side effects reviewed; to get from pharmacy.   Colon cancer screening UTD.   MOST form reviewed, updated. Full Code, Full Care Asked her to get [place] copies of her living will and healthcare power of attorney.   Medicare Attestation I have personally reviewed: The patient's medical and social history Their use of alcohol, tobacco or illicit drugs Their current medications and supplements The patient's functional ability including ADLs,fall risks, home safety risks, cognitive, and hearing and  visual impairment Diet and physical activities Evidence for depression or mood disorders  The patient's weight, height, BMI have been recorded in the chart.  I have made referrals, counseling, and provided education to the patient based on review of the above and I have provided the patient with a written personalized care plan for preventive services.

## 2023-03-23 ENCOUNTER — Encounter: Payer: Self-pay | Admitting: Family Medicine

## 2023-03-23 ENCOUNTER — Ambulatory Visit (INDEPENDENT_AMBULATORY_CARE_PROVIDER_SITE_OTHER): Payer: Medicare Other | Admitting: Family Medicine

## 2023-03-23 VITALS — BP 116/70 | HR 63 | Ht 67.0 in | Wt 157.4 lb

## 2023-03-23 DIAGNOSIS — Z23 Encounter for immunization: Secondary | ICD-10-CM

## 2023-03-23 DIAGNOSIS — Z0000 Encounter for general adult medical examination without abnormal findings: Secondary | ICD-10-CM | POA: Diagnosis not present

## 2023-03-23 DIAGNOSIS — I700 Atherosclerosis of aorta: Secondary | ICD-10-CM

## 2023-03-23 DIAGNOSIS — R030 Elevated blood-pressure reading, without diagnosis of hypertension: Secondary | ICD-10-CM | POA: Diagnosis not present

## 2023-03-23 DIAGNOSIS — Z780 Asymptomatic menopausal state: Secondary | ICD-10-CM

## 2023-03-23 DIAGNOSIS — Z5181 Encounter for therapeutic drug level monitoring: Secondary | ICD-10-CM

## 2023-03-23 DIAGNOSIS — E7800 Pure hypercholesterolemia, unspecified: Secondary | ICD-10-CM

## 2023-03-23 DIAGNOSIS — K219 Gastro-esophageal reflux disease without esophagitis: Secondary | ICD-10-CM | POA: Diagnosis not present

## 2023-03-23 DIAGNOSIS — E559 Vitamin D deficiency, unspecified: Secondary | ICD-10-CM | POA: Diagnosis not present

## 2023-03-23 LAB — POCT URINALYSIS DIP (PROADVANTAGE DEVICE)
Bilirubin, UA: NEGATIVE
Blood, UA: NEGATIVE
Glucose, UA: NEGATIVE mg/dL
Ketones, POC UA: NEGATIVE mg/dL
Leukocytes, UA: NEGATIVE
Nitrite, UA: NEGATIVE
Protein Ur, POC: NEGATIVE mg/dL
Specific Gravity, Urine: 1.01
Urobilinogen, Ur: NEGATIVE
pH, UA: 7 (ref 5.0–8.0)

## 2023-03-23 MED ORDER — VITAMIN D (ERGOCALCIFEROL) 1.25 MG (50000 UNIT) PO CAPS: ORAL_CAPSULE | ORAL | 3 refills | Status: DC

## 2023-03-24 LAB — VITAMIN D 25 HYDROXY (VIT D DEFICIENCY, FRACTURES): Vit D, 25-Hydroxy: 46.8 ng/mL (ref 30.0–100.0)

## 2023-03-24 LAB — CBC WITH DIFFERENTIAL/PLATELET
Basophils Absolute: 0 10*3/uL (ref 0.0–0.2)
Basos: 1 %
EOS (ABSOLUTE): 0.2 10*3/uL (ref 0.0–0.4)
Eos: 2 %
Hematocrit: 41.4 % (ref 34.0–46.6)
Hemoglobin: 14.1 g/dL (ref 11.1–15.9)
Immature Grans (Abs): 0 10*3/uL (ref 0.0–0.1)
Immature Granulocytes: 0 %
Lymphocytes Absolute: 2.1 10*3/uL (ref 0.7–3.1)
Lymphs: 25 %
MCH: 33.3 pg — ABNORMAL HIGH (ref 26.6–33.0)
MCHC: 34.1 g/dL (ref 31.5–35.7)
MCV: 98 fL — ABNORMAL HIGH (ref 79–97)
Monocytes Absolute: 0.4 10*3/uL (ref 0.1–0.9)
Monocytes: 5 %
Neutrophils Absolute: 5.8 10*3/uL (ref 1.4–7.0)
Neutrophils: 67 %
Platelets: 339 10*3/uL (ref 150–450)
RBC: 4.24 x10E6/uL (ref 3.77–5.28)
RDW: 12.2 % (ref 11.7–15.4)
WBC: 8.6 10*3/uL (ref 3.4–10.8)

## 2023-03-24 LAB — LIPID PANEL
Chol/HDL Ratio: 2.4 ratio (ref 0.0–4.4)
Cholesterol, Total: 159 mg/dL (ref 100–199)
HDL: 66 mg/dL (ref >39)
LDL Chol Calc (NIH): 76 mg/dL (ref 0–99)
Triglycerides: 92 mg/dL (ref 0–149)
VLDL Cholesterol Cal: 17 mg/dL (ref 5–40)

## 2023-03-24 LAB — CMP14+EGFR
ALT: 20 IU/L (ref 0–32)
AST: 21 [IU]/L (ref 0–40)
Albumin: 4.4 g/dL (ref 3.8–4.8)
Alkaline Phosphatase: 105 IU/L (ref 44–121)
BUN/Creatinine Ratio: 18 (ref 12–28)
BUN: 14 mg/dL (ref 8–27)
Bilirubin Total: 0.6 mg/dL (ref 0.0–1.2)
CO2: 24 mmol/L (ref 20–29)
Calcium: 9.6 mg/dL (ref 8.7–10.3)
Chloride: 103 mmol/L (ref 96–106)
Creatinine, Ser: 0.78 mg/dL (ref 0.57–1.00)
Globulin, Total: 2.3 g/dL (ref 1.5–4.5)
Glucose: 88 mg/dL (ref 70–99)
Potassium: 4.2 mmol/L (ref 3.5–5.2)
Sodium: 141 mmol/L (ref 134–144)
Total Protein: 6.7 g/dL (ref 6.0–8.5)
eGFR: 79 mL/min/{1.73_m2} (ref >59)

## 2023-05-01 ENCOUNTER — Other Ambulatory Visit: Payer: Self-pay | Admitting: Family Medicine

## 2023-05-01 DIAGNOSIS — I700 Atherosclerosis of aorta: Secondary | ICD-10-CM

## 2023-05-01 DIAGNOSIS — E7800 Pure hypercholesterolemia, unspecified: Secondary | ICD-10-CM

## 2023-05-09 DIAGNOSIS — Z1231 Encounter for screening mammogram for malignant neoplasm of breast: Secondary | ICD-10-CM | POA: Diagnosis not present

## 2023-05-09 LAB — HM MAMMOGRAPHY

## 2023-05-10 ENCOUNTER — Encounter: Payer: Self-pay | Admitting: *Deleted

## 2023-05-19 ENCOUNTER — Encounter: Payer: Self-pay | Admitting: Family Medicine

## 2023-05-19 NOTE — Telephone Encounter (Signed)
Usually [facility] faxes an order to be signed when a patient schedules a DEXA.  I haven't gotten anything yet.  Okay to fax over order for DEXA. Diagnosis is postmenopausal estrogen deficiency.  Thanks

## 2023-06-21 DIAGNOSIS — E2839 Other primary ovarian failure: Secondary | ICD-10-CM | POA: Diagnosis not present

## 2023-06-21 DIAGNOSIS — M8588 Other specified disorders of bone density and structure, other site: Secondary | ICD-10-CM | POA: Diagnosis not present

## 2023-06-21 DIAGNOSIS — E28319 Asymptomatic premature menopause: Secondary | ICD-10-CM | POA: Diagnosis not present

## 2023-06-21 LAB — HM DEXA SCAN

## 2023-06-23 ENCOUNTER — Encounter: Payer: Self-pay | Admitting: Family Medicine

## 2023-07-04 DIAGNOSIS — Z6825 Body mass index (BMI) 25.0-25.9, adult: Secondary | ICD-10-CM | POA: Diagnosis not present

## 2023-07-04 DIAGNOSIS — Z01419 Encounter for gynecological examination (general) (routine) without abnormal findings: Secondary | ICD-10-CM | POA: Diagnosis not present

## 2023-07-04 DIAGNOSIS — A609 Anogenital herpesviral infection, unspecified: Secondary | ICD-10-CM | POA: Diagnosis not present

## 2023-08-05 DIAGNOSIS — S8262XD Displaced fracture of lateral malleolus of left fibula, subsequent encounter for closed fracture with routine healing: Secondary | ICD-10-CM | POA: Diagnosis not present

## 2023-08-05 DIAGNOSIS — M7062 Trochanteric bursitis, left hip: Secondary | ICD-10-CM | POA: Diagnosis not present

## 2023-08-05 DIAGNOSIS — M25552 Pain in left hip: Secondary | ICD-10-CM | POA: Diagnosis not present

## 2023-08-11 DIAGNOSIS — M7072 Other bursitis of hip, left hip: Secondary | ICD-10-CM | POA: Diagnosis not present

## 2023-08-11 DIAGNOSIS — M25552 Pain in left hip: Secondary | ICD-10-CM | POA: Diagnosis not present

## 2023-08-28 DIAGNOSIS — M25572 Pain in left ankle and joints of left foot: Secondary | ICD-10-CM | POA: Diagnosis not present

## 2023-08-28 DIAGNOSIS — S8262XD Displaced fracture of lateral malleolus of left fibula, subsequent encounter for closed fracture with routine healing: Secondary | ICD-10-CM | POA: Diagnosis not present

## 2023-09-06 DIAGNOSIS — S93492A Sprain of other ligament of left ankle, initial encounter: Secondary | ICD-10-CM | POA: Diagnosis not present

## 2023-09-27 DIAGNOSIS — M25572 Pain in left ankle and joints of left foot: Secondary | ICD-10-CM | POA: Diagnosis not present

## 2023-11-08 DIAGNOSIS — M7062 Trochanteric bursitis, left hip: Secondary | ICD-10-CM | POA: Diagnosis not present

## 2023-11-08 DIAGNOSIS — S93492A Sprain of other ligament of left ankle, initial encounter: Secondary | ICD-10-CM | POA: Diagnosis not present

## 2023-11-08 DIAGNOSIS — M2011 Hallux valgus (acquired), right foot: Secondary | ICD-10-CM | POA: Diagnosis not present

## 2023-11-21 ENCOUNTER — Ambulatory Visit: Admitting: Podiatry

## 2024-01-27 ENCOUNTER — Other Ambulatory Visit: Payer: Self-pay | Admitting: Family Medicine

## 2024-01-27 DIAGNOSIS — E7800 Pure hypercholesterolemia, unspecified: Secondary | ICD-10-CM

## 2024-01-27 DIAGNOSIS — I700 Atherosclerosis of aorta: Secondary | ICD-10-CM

## 2024-01-27 NOTE — Telephone Encounter (Signed)
 Has an appointment in November

## 2024-02-01 DIAGNOSIS — M25561 Pain in right knee: Secondary | ICD-10-CM | POA: Diagnosis not present

## 2024-03-01 ENCOUNTER — Ambulatory Visit

## 2024-03-01 DIAGNOSIS — Z23 Encounter for immunization: Secondary | ICD-10-CM

## 2024-03-07 ENCOUNTER — Ambulatory Visit: Admitting: Podiatry

## 2024-03-26 ENCOUNTER — Ambulatory Visit: Payer: Medicare Other | Admitting: Family Medicine

## 2024-03-29 ENCOUNTER — Ambulatory Visit (INDEPENDENT_AMBULATORY_CARE_PROVIDER_SITE_OTHER): Admitting: Podiatry

## 2024-03-29 ENCOUNTER — Ambulatory Visit (INDEPENDENT_AMBULATORY_CARE_PROVIDER_SITE_OTHER)

## 2024-03-29 VITALS — Ht 67.0 in | Wt 157.4 lb

## 2024-03-29 DIAGNOSIS — M2011 Hallux valgus (acquired), right foot: Secondary | ICD-10-CM

## 2024-03-29 DIAGNOSIS — M21611 Bunion of right foot: Secondary | ICD-10-CM

## 2024-03-29 DIAGNOSIS — M79671 Pain in right foot: Secondary | ICD-10-CM | POA: Diagnosis not present

## 2024-03-30 NOTE — Progress Notes (Signed)
 Subjective:  Patient ID: Annette Monroe, female    DOB: 1945/07/11,  MRN: [id number]  Chief Complaint  Patient presents with   Bunions    RM 5 Patient is here for right bunion pain. Pt state pain has been present for the past 7-8 months. Pt is concerned about a knot located by the right ankle, knot has been present for 2-3 years.    Discussed the use of AI scribe software for clinical note transcription with the patient, who gave verbal consent to proceed.  History of Present Illness Annette Monroe is a 78 year old female who presents with foot pain and discomfort due to a bunion.  She has been experiencing pain and discomfort from a bunion on her right foot for several months, possibly longer. The pain has become significant enough to seek medical attention. Her shoe size has increased from an eight and a half to a nine over time, which she suspects may be related to her foot issues.  She describes a noticeable 'knot' on her foot, which is painful and has been present for some time. The discomfort is exacerbated by wearing boots, which apply pressure to the affected area. She recalls noticing the issue more prominently last winter. No history of injury to the area.  Her mother also had severe bunions, which influences her concern about her current condition. She experiences a callus on the side of her toe, which she attributes to the bunion causing her toe to drift.  She lives alone and is widowed, with her master bedroom on the first floor, minimizing the need for stairs. She has friends and a brother who can assist her post-procedure. She is concerned about the recovery period, particularly the impact on her ability to drive and manage daily activities.  She takes a vitamin D  supplement, specifically a 50,000 unit dose, during the summer months. She has a history of vitamin D  deficiency. No heart or lung problems, diabetes, or smoking.      Objective:    Physical  Exam VASCULAR: DP and PT pulse palpable. Foot is warm and well-perfused. Capillary fill time is brisk. Good pulses in foot. DERMATOLOGIC: Normal skin turgor texture and temperature. No open lesions or rashes or ulcerations. NEUROLOGIC: Normal sensation to light touch and pressure. No paresthesias on examination. ORTHOPEDIC: Haloxotomy of right foot with hypermobility at first metatarsal joint. Pain on medial eminence with dorsal medial bunion. Good range of motion of MTP without arthritic changes. Painful prominent navicular tuberosity. No pain on posterior tibial tendon proximally along ankle. Smooth pain-free range of motion of all examined joints. No ecchymosis or bruising. No gross deformity. No pain to palpation.   No images are attached to the encounter.    Results Right foot radiographs taken today show moderate to severe hallux valgus deformity with dorsal medial bunion no arthritic changes in the MTP joint, accessory navicular.  She has hallux interphalangeus deformity    Assessment:   1. Bunion of right foot   2. Pain associated with accessory navicular bone of foot, right   3. Hallux valgus with bunions, right      Plan:  Patient was evaluated and treated and all questions answered.  Assessment and Plan Assessment & Plan Right foot hallux valgus (bunion) with first metatarsal hypermobility Chronic right foot hallux valgus with associated first metatarsal hypermobility, causing pain and discomfort. Non-surgical treatments have been unsuccessful. Surgical intervention is considered due to the severity and persistence of symptoms. The lapidus procedure is recommended  to address the hypermobility and realign the bones, reducing recurrence risk. Risks include infection, anesthesia complications, and blood clots, but these are low. Recovery involves non-weight bearing for 2-3 weeks, followed by weight-bearing in a boot for 6 weeks. She is considering timing due to upcoming holidays  and personal circumstances. - Surgically we discussed Lapidus bunionectomy to address the hypermobility of the first TMT and hallux valgus deformity as well as Akin osteotomy to correct the hallux interphalangeus deformity that is present as well.  Discussed risk benefits and potential complications that could arise from surgery as well including but are not limited to pain, swelling, infection, scar, numbness which may be temporary or permanent, chronic pain, stiffness, nerve pain or damage, wound healing problems, bone healing problems including delayed or non-union.  She understands and wishes to proceed.  We discussed the recovery process including a period of nonweightbearing for approximately 2 to 3 weeks followed by partial weightbearing in a cam walker boot.  Her vitamin D  level is stable and adequate, we will also plan for Kidner procedure at the same time of surgery which we discussed as well. - All questions addressed.  Informed consent signed and reviewed.   Painful accessory navicular (navicular tuberosity) of right foot Painful accessory navicular tuberosity on the right foot, possibly contributing to discomfort. Consideration for concurrent surgical intervention with the bunion correction. The Kidner procedure is discussed to resect the accessory navicular and reattach the posterior tibial tendon, which could be performed simultaneously with the bunion surgery.    Surgical plan:  Procedure: - Right foot Lapidus bunionectomy, Akin osteotomy, Kidner procedure  Location: - [facility]  Anesthesia plan: - Sedation with regional block  Postoperative pain plan: - Tylenol 1000 mg every 6 hours, ibuprofen 600 mg every 6 hours, gabapentin 300 mg every 8 hours x5 days, oxycodone 5 mg 1-2 tabs every 6 hours only as needed  DVT prophylaxis: - Aspirin 81 twice daily  WB Restrictions / DME needs: - Nonweightbearing in CAM boot postop       No follow-ups on file.

## 2024-04-03 NOTE — Patient Instructions (Incomplete)
 HEALTH MAINTENANCE RECOMMENDATIONS:  It is recommended that you get at least 30 minutes of aerobic exercise at least 5 days/week (for weight loss, you may need as much as 60-90 minutes). This can be any activity that gets your heart rate up. This can be divided in 10-15 minute intervals if needed, but try and build up your endurance at least once a week.  Weight bearing exercise is also recommended twice weekly.  Eat a healthy diet with lots of vegetables, fruits and fiber.  Colorful foods have a lot of vitamins (ie green vegetables, tomatoes, red peppers, etc).  Limit sweet tea, regular sodas and alcoholic beverages, all of which has a lot of calories and sugar.  Up to 1 alcoholic drink daily may be beneficial for women (unless trying to lose weight, watch sugars).  Drink a lot of water.  Calcium  recommendations are 1200-1500 mg daily (1500 mg for postmenopausal women or women without ovaries), and vitamin D  1000 IU daily.  This should be obtained from diet and/or supplements (vitamins), and calcium  should not be taken all at once, but in divided doses.  Monthly self breast exams and yearly mammograms for women over the age of 48 is recommended.  Sunscreen of at least SPF 30 should be used on all sun-exposed parts of the skin when outside between the hours of 10 am and 4 pm (not just when at beach or pool, but even with exercise, golf, tennis, and yard work!)  Use a sunscreen that says broad spectrum so it covers both UVA and UVB rays, and make sure to reapply every 1-2 hours.  Remember to change the batteries in your smoke detectors when changing your clock times in the spring and fall. Carbon monoxide detectors are recommended for your home.  Use your seat belt every time you are in a car, and please drive safely and not be distracted with cell phones and texting while driving.   Annette Monroe , Thank you for taking time to come for your Medicare Wellness Visit. I appreciate your ongoing  commitment to your health goals. Please review the following plan we discussed and let me know if I can assist you in the future.   This is a list of the screening recommended for you and due dates:  Health Maintenance  Topic Date Due   Zoster (Shingles) Vaccine (1 of 2) 03/17/1998   DTaP/Tdap/Td vaccine (3 - Td or Tdap) 02/07/2021   COVID-19 Vaccine (8 - 2025-26 season) 01/30/2024   Medicare Annual Wellness Visit  03/22/2024   Breast Cancer Screening  05/08/2024   Pneumococcal Vaccine for age over 28  Completed   Flu Shot  Completed   DEXA scan (bone density measurement)  Completed   Hepatitis C Screening  Completed   Meningitis B Vaccine  Aged Out   Colon Cancer Screening  Discontinued   Please bring us  copies of your Living Will and Healthcare Power of Attorney so that it can be scanned into your medical chart.  We discussed the need to get tetanus booster (TdaP) from the pharmacy. This is past due.  We also discussed the recommendation to get RSV vaccine from the pharmacy.  I recommend getting this soon--you need to separate this from the COVID vaccine by 2 weeks.   I would get this sooner rather than later, due to the seasonal nature of this illness.  Shingles vaccine (Shingrix) was also discussed--series of 2 vaccines given 2 months apart. You need to get this from the pharmacy.  Prevnar-20 is also recommended, as well as COVID booster. We discussed arranging for nurse visits to get these from our office (or you can get from the pharmacy, your choice).  Continue to periodically monitor your blood pressure.  Remember that pain can contribute to higher blood pressures. If you have a reading of >140, and are in pain, take something for your pain (tylenol, voltaren gel, etc) and recheck. If you have blood pressures that are consistently >135-140/85-90 then we should reconsider treatment. Goal BP is <130/80. Continue to follow low sodium diet. Be sure to get your 30 minutes of  exercise daily--you may need to adjust what you do, based on your pain. We discussed water exercises, seated exercises.  We discussed using Tylenol Arthritis and/or Voltaren Gel as needed to help with your pain (ankle/knee/shoulder).

## 2024-04-03 NOTE — Progress Notes (Unsigned)
 No chief complaint on file.   Subjective:   Annette Monroe is a 78 y.o. female who presents for a Medicare Annual Wellness Visit. She is also here to  follow-up on chronic medical conditions.  She is under the care of Dr. Marget for GYN care (last seen 07/2023).  She recently saw Dr. Silva (podiatry) with complaints of right foot pain, related to bunion.  They discussed surgery, including removal of navicular tuberosity with Kidner procedure.  Hyperlipidemia/atherosclerosis.  She was started on Crestor  in November 2021 for atherosclerosis (thoracic aorta, seen on CXR, and abdominal noted on abd CT) . ASCVD risk was 13.5%. Coronary calcium  01/2020 was 0, but calcifications were noted in aortic root and descending thoracic aorta. She is taking Crestor  10mg  twice a week, and tolerating it. She denies myalgias.  She continues to try and follow a low cholesterol diet (doesn't eat red meat, uses vinaigrette dressings, uses 2% string cheese as a snack, eating more egg whites). She is due for recheck.  Lab Results  Component Value Date   CHOL 159 03/23/2023   HDL 66 03/23/2023   LDLCALC 76 03/23/2023   TRIG 92 03/23/2023   CHOLHDL 2.4 03/23/2023    H/o GERD:  This was evaluated/treated by [facility]. She had unremarkable CT of abdomen in 07/2020 (colonic diverticulosis, aortic atherosclerosis noted). PPI was prescribed, and she underwent EGD in 12/2020 which showed gastritis, biopsy was negative for H.pylori.  She no longer takes any PPI. She still has some occasional burning in her upper stomach, related to eating, never into the chest.  Hasn't found a trigger. Denies dysphagia or abdominal pain.  She uses Tums prn, with good results. Symptoms are usually in the evening.  Gets slight burning after coffee in the morning.  Elevated blood pressures: She didn't tolerate HCTZ in the past (due to heaviness in her chest, and a fluttering awareness).  She continues to try and limit the sodium in her  diet.  BP's have been running ***    Denies headaches, dizziness, chest pain or shortness of breath.   BP Readings from Last 3 Encounters:  03/23/23 116/70  02/10/23 130/70  01/27/23 (!) 167/93     H/o occipital headaches/neck pain.  C-spine films showed disc osteophytic disease at C3-C4 and C5-C6. She continues to use a stand for her iPad to help limit looking down, and her headaches and neck pain remain improved.  Her morning occipital discomfort improved after getting a new pillow. She hasn't had any recurrence of pain. No longer does home exercises.   History of vitamin D  deficiency: Level was slightly low at 29.8 in 01/2020 when taking 50,000 IU of vitamin D  once a month (on the 15th of the month). This was changed to taking it every 2 weeks. Recheck in 07/2020 was 45.9. She switches to once a month through the summer months, twice a month the rest of the year. Last level was 46.8 in 03/2023, when she had been taking the vitamin D  twice a month since September. She is currently taking 50,000 IU twice a month. ***    Immunization History  Administered Date(s) Administered   Fluad Quad(high Dose 65+) 02/12/2021, 02/25/2022   Fluad Trivalent(High Dose 65+) 03/23/2023   INFLUENZA, HIGH DOSE SEASONAL PF 03/19/2013, 02/28/2014, 03/01/2024   Influenza Split 03/01/2011, 02/15/2012, 03/05/2019   Influenza Whole 03/28/2007   Influenza,inj,Quad PF,6+ Mos 03/10/2015, 03/03/2016, 03/02/2017, 03/29/2018, 03/26/2020   PFIZER Comirnaty(Gray Top)Covid-19 Tri-Sucrose Vaccine 10/07/2020   PFIZER(Purple Top)SARS-COV-2 Vaccination  07/05/2019, 07/30/2019, 02/23/2020   Pfizer Covid-19 Vaccine Bivalent Booster 57yrs & up 03/09/2021   Pfizer(Comirnaty)Fall Seasonal Vaccine 12 years and older 04/05/2022, 02/14/2023   Pneumococcal Conjugate-13 08/20/2013   Pneumococcal Polysaccharide-23 08/22/2014   Td 07/05/1995   Tdap 02/08/2011   Zoster, Live 01/04/2013   Last Pap smear: 09/2017, Dr. Marget, normal.  S/p hysterectomy. Sees GYN yearly, last 07/2023. Last mammogram: 05/2023 Last colonoscopy: 02/2018 internal hemorrhoids and diverticulosis (Dr. Burnette). DEXA: 06/2023 T-1.1 at R fem neck (at [facility] [place]; prior was done in 01/2014, normal, through Dr. Elise office) Dentist: twice yearly  (no longer sees periodontist). She has had some extractions, and is considering implants. Ophtho: once yearly.  Exercise:    Spring/Summer (mild weather) she walked at [facility], cut back when it got too hot.  She does DVD's indoors occasionally.    Has been waking 2 miles 3x/week since the weather is cooler. She joined a hiking club at [employer] been once so far. She plans to go back to the [facility].   History: Past Medical History:  Diagnosis Date   Abscessed tooth    Allergy    Alopecia    Carpal tunnel syndrome of right wrist    Cataract    Genital herpes    GERD (gastroesophageal reflux disease)    Glaucoma suspect    Heart murmur    History of esophagogastroduodenoscopy (EGD) 01/07/2021   Gastritis-Dr.Outlaw   Hypertensive retinopathy of both eyes    h/o hemes, resolved.   Iritis 2004, 2007   Dr. Sharalyn, and [facility]; negative work-up   Pneumonia age 56   pt was hospitalized at age 55   Unspecified vitamin D  deficiency 2012   treated by Dr. Darina initially, then here   Past Surgical History:  Procedure Laterality Date   ABDOMINAL HYSTERECTOMY  05/31/1998   partial-ovaries remaining   APPENDECTOMY  06/01/1979   BREAST SURGERY  05/31/2002   breast reduction   DIAGNOSTIC LAPAROSCOPY  05/31/1997   Family History  Problem Relation Age of Onset   Hypertension Mother    Diabetes Mother    Stroke Mother    Dementia Mother    Cancer Mother        leukemia (chronic)   Depression Mother    Diabetes Father    Hypertension Father    Parkinsonism Father    Hypertension Sister    COPD Sister    Heart disease Sister 63       (smoker)   Hypertension Brother    Stroke Brother     Hyperlipidemia Brother    Hypertension Brother    Hyperlipidemia Brother    Hyperlipidemia Brother    Hypertension Brother    Social History   Occupational History   Occupation: retired    Associate Professor: [employer]  Tobacco Use   Smoking status: Never   Smokeless tobacco: Never  Vaping Use   Vaping status: Never Used  Substance and Sexual Activity   Alcohol use: No    Comment: rarely, maybe once a year.   Drug use: No   Sexual activity: Not Currently   Outpatient Encounter Medications as of 04/04/2024  Medication Sig Note   BIOTIN PO Take by mouth.  03/23/2023: Last dose yesterday   Multiple Vitamins-Minerals (CENTRUM ULTRA WOMENS PO) Take 1 tablet by mouth daily. 03/23/2023: Last dose yesterday   rosuvastatin  (CRESTOR ) 10 MG tablet TAKE 1 TABLET(10 MG) BY MOUTH 2 TIMES A WEEK    valACYclovir  (VALTREX ) 500 MG tablet Take 1 tablet  by mouth daily. (Patient not taking: Reported on 03/29/2024) 02/10/2023: As needed   Vitamin D , Ergocalciferol , (DRISDOL ) 1.25 MG (50000 UNIT) CAPS capsule TAKE 1 CAPSULE BY MOUTH EVERY 14 DAYS    No facility-administered encounter medications on file as of 04/04/2024.   Allergies  Allergen Reactions   Benadryl [Diphenhydramine]     Gives her palpitations   Finasteride Other (See Comments)    Breast tenderness/fullness    Tobacco Counseling Counseling given: Not Answered  SDOH Screenings   Depression (PHQ2-9): Low Risk  (03/23/2023)  Tobacco Use: Low Risk  (03/23/2023)   Depression Screen    03/23/2023    8:36 AM 02/25/2022    9:43 AM 02/12/2021    8:35 AM 02/07/2020    8:46 AM 01/29/2019    1:48 PM 09/05/2017    8:46 AM 08/30/2016    8:51 AM  PHQ 2/9 Scores  PHQ - 2 Score 0 0 0 0 0 0 0     Goals Addressed   None    No data recorded      Objective:    There were no vitals filed for this visit. There is no height or weight on file to calculate BMI.  Wt Readings from Last 3 Encounters:  03/29/24 157 lb 6.4 oz (71.4 kg)  03/23/23 157 lb 6.4  oz (71.4 kg)  02/10/23 157 lb 12.8 oz (71.6 kg)    General Appearance:    Alert, cooperative, no distress, appears stated age  Head:    Normocephalic, without obvious abnormality, atraumatic     Eyes:    Pupils are equal, round, reactive; conjunctiva/corneas clear, EOM's intact; fundi not well visualized today  Ears:    Normal TM's and external ear canals.   Nose:    No drainage or sinus tenderness   Throat:    Normal mucosa, no lesions  Neck:    Supple, no lymphadenopathy; thyroid : no enlargement/ tenderness/nodules; no carotid bruit or JVD     Back:    Spine nontender, no curvature, ROM normal, no CVA tenderness   Lungs:    Clear to auscultation bilaterally without wheezes, rales or ronchi; respirations unlabored     Chest Wall:    No tenderness or deformity     Heart:    Regular rate and rhythm, S1 and S2 normal, no murmur, rub or gallop     Breast Exam:    Deferred to GYN     Abdomen:    Soft, non-tender, nondistended, normoactive bowel sounds, no masses, no hepatosplenomegaly     Genitalia:    Deferred to GYN            Extremities:    No clubbing, cyanosis or edema.  Pulses:    2+ and symmetric all extremities.  Skin:    Skin color, texture, turgor normal, no rashes or lesions. She has diffuse, significant thinning of hair on top of her head. Scalp is normal (no inflammation, flaking). Curvy shaped mole just to the right of thoracic spine, uniform color, unchanged (compared to photo on her phone from 2019).   Lymph nodes:    Cervical, supraclavicular nodes normal     Neurologic:    Normal strength, sensation and gait; reflexes 2+ and symmetric throughout                       Psych:   Normal mood, affect, hygiene and grooming  ***UPDATE SKIN     Assessment/Plan:  This is a routine  wellness examination for [place].   COVID Prevnar-20 also due.  Did she ever get Tdap, shingrix or RSV from pharmacy?  Recommended  TSH if symptoms (last 2022)   Immunization recommendations  discussed-- Continue yearly flu shots. COVID booster *** Prevnar-20 was offered/declined. RSV vaccine recommended, to get from pharmacy in 2 weeks.  Tdap booster is past due now, to get from pharmacy.  Shingrix recommended, risks/side effects reviewed; to get from pharmacy.   Colon cancer screening UTD.   MOST form reviewed, updated. Full Code, Full Care Copies of her living will and healthcare POA were requested.   Patient Care Team: Randol Dawes, MD as PCP - General (Family Medicine) Dr. Caron Edison, DDS as Consulting Physician (Dentistry) Marget Lenis, MD as Consulting Physician (Obstetrics and Gynecology) Patrcia Agent, DDS as Consulting Physician (Dentistry) Burnette Fallow, MD as Consulting Physician (Gastroenterology) Mollie Greig PARAS, MD as Consulting Physician (Dermatology) Carlie Clark, MD as Consulting Physician (Otolaryngology) Dr. Fleeta  Dr. Barton (ortho, for ankle fracture 05/2022) Dr. Cara  I have personally reviewed and noted the following in the patient's chart:   Medical and social history Use of alcohol, tobacco or illicit drugs  Current medications and supplements including opioid prescriptions. Functional ability and status Nutritional status Physical activity Advanced directives List of other physicians Hospitalizations, surgeries, and ER visits in previous 12 months Vitals Screenings to include cognitive, depression, and falls Referrals and appointments  No orders of the defined types were placed in this encounter.  In addition, I have reviewed and discussed with patient certain preventive protocols, quality metrics, and best practice recommendations. A written personalized care plan for preventive services as well as general preventive health recommendations were provided to patient.   Dawes DELENA Randol, MD   04/03/2024   No follow-ups on file.  After Visit Summary: (In Person-Printed) AVS printed and given to the patient  Nurse Notes:  ***

## 2024-04-04 ENCOUNTER — Ambulatory Visit: Payer: Medicare Other | Admitting: Family Medicine

## 2024-04-04 ENCOUNTER — Encounter: Payer: Self-pay | Admitting: Family Medicine

## 2024-04-04 VITALS — BP 130/84 | HR 80 | Ht 66.0 in | Wt 154.6 lb

## 2024-04-04 DIAGNOSIS — M85851 Other specified disorders of bone density and structure, right thigh: Secondary | ICD-10-CM | POA: Diagnosis not present

## 2024-04-04 DIAGNOSIS — Z0000 Encounter for general adult medical examination without abnormal findings: Secondary | ICD-10-CM | POA: Diagnosis not present

## 2024-04-04 DIAGNOSIS — I700 Atherosclerosis of aorta: Secondary | ICD-10-CM | POA: Diagnosis not present

## 2024-04-04 DIAGNOSIS — R030 Elevated blood-pressure reading, without diagnosis of hypertension: Secondary | ICD-10-CM

## 2024-04-04 DIAGNOSIS — Z23 Encounter for immunization: Secondary | ICD-10-CM

## 2024-04-04 DIAGNOSIS — Z5181 Encounter for therapeutic drug level monitoring: Secondary | ICD-10-CM

## 2024-04-04 DIAGNOSIS — E559 Vitamin D deficiency, unspecified: Secondary | ICD-10-CM

## 2024-04-04 DIAGNOSIS — K219 Gastro-esophageal reflux disease without esophagitis: Secondary | ICD-10-CM | POA: Diagnosis not present

## 2024-04-04 DIAGNOSIS — E7800 Pure hypercholesterolemia, unspecified: Secondary | ICD-10-CM | POA: Diagnosis not present

## 2024-04-04 LAB — LIPID PANEL

## 2024-04-04 MED ORDER — VITAMIN D (ERGOCALCIFEROL) 1.25 MG (50000 UNIT) PO CAPS: ORAL_CAPSULE | ORAL | 3 refills | Status: AC

## 2024-04-04 MED ORDER — ROSUVASTATIN CALCIUM 10 MG PO TABS: ORAL_TABLET | ORAL | 3 refills | Status: AC

## 2024-04-05 ENCOUNTER — Ambulatory Visit: Payer: Self-pay | Admitting: Family Medicine

## 2024-04-05 LAB — LIPID PANEL
Cholesterol, Total: 152 mg/dL (ref 100–199)
HDL: 64 mg/dL (ref >39)
LDL CALC COMMENT:: 2.4 ratio (ref 0.0–4.4)
LDL Chol Calc (NIH): 74 mg/dL (ref 0–99)
Triglycerides: 71 mg/dL (ref 0–149)
VLDL Cholesterol Cal: 14 mg/dL (ref 5–40)

## 2024-04-05 LAB — CBC WITH DIFFERENTIAL/PLATELET
Basophils Absolute: 0 x10E3/uL (ref 0.0–0.2)
Basos: 0 %
EOS (ABSOLUTE): 0.1 x10E3/uL (ref 0.0–0.4)
Eos: 1 %
Hematocrit: 42.3 % (ref 34.0–46.6)
Hemoglobin: 14 g/dL (ref 11.1–15.9)
Immature Grans (Abs): 0 x10E3/uL (ref 0.0–0.1)
Immature Granulocytes: 0 %
Lymphocytes Absolute: 2.2 x10E3/uL (ref 0.7–3.1)
Lymphs: 31 %
MCH: 32 pg (ref 26.6–33.0)
MCHC: 33.1 g/dL (ref 31.5–35.7)
MCV: 97 fL (ref 79–97)
Monocytes Absolute: 0.5 x10E3/uL (ref 0.1–0.9)
Monocytes: 7 %
Neutrophils Absolute: 4.3 x10E3/uL (ref 1.4–7.0)
Neutrophils: 61 %
Platelets: 335 x10E3/uL (ref 150–450)
RBC: 4.38 x10E6/uL (ref 3.77–5.28)
RDW: 12.2 % (ref 11.7–15.4)
WBC: 7.1 x10E3/uL (ref 3.4–10.8)

## 2024-04-05 LAB — COMPREHENSIVE METABOLIC PANEL WITH GFR
ALT: 15 IU/L (ref 0–32)
AST: 21 IU/L (ref 0–40)
Albumin: 4.2 g/dL (ref 3.8–4.8)
Alkaline Phosphatase: 107 IU/L (ref 49–135)
BUN/Creatinine Ratio: 22 (ref 12–28)
BUN: 16 mg/dL (ref 8–27)
Bilirubin Total: 0.6 mg/dL (ref 0.0–1.2)
CO2: 20 mmol/L (ref 20–29)
Calcium: 9.5 mg/dL (ref 8.7–10.3)
Chloride: 103 mmol/L (ref 96–106)
Creatinine, Ser: 0.72 mg/dL (ref 0.57–1.00)
Globulin, Total: 2.3 g/dL (ref 1.5–4.5)
Glucose: 89 mg/dL (ref 70–99)
Potassium: 3.8 mmol/L (ref 3.5–5.2)
Sodium: 140 mmol/L (ref 134–144)
Total Protein: 6.5 g/dL (ref 6.0–8.5)
eGFR: 87 mL/min/1.73 (ref >59)

## 2024-04-05 LAB — VITAMIN D 25 HYDROXY (VIT D DEFICIENCY, FRACTURES): Vit D, 25-Hydroxy: 41.5 ng/mL (ref 30.0–100.0)

## 2024-04-05 NOTE — Addendum Note (Signed)
 Addended by: VIVIAN COUNTRYMAN F on: 04/05/2024 12:39 PM   Modules accepted: Orders

## 2024-04-05 NOTE — Addendum Note (Signed)
 Addended by: VIVIAN COUNTRYMAN F on: 04/05/2024 12:33 PM   Modules accepted: Orders

## 2024-04-05 NOTE — Addendum Note (Signed)
 Addended by: VIVIAN COUNTRYMAN F on: 04/05/2024 12:43 PM   Modules accepted: Orders

## 2024-04-09 ENCOUNTER — Other Ambulatory Visit

## 2024-04-16 ENCOUNTER — Ambulatory Visit (INDEPENDENT_AMBULATORY_CARE_PROVIDER_SITE_OTHER): Admitting: Internal Medicine

## 2024-04-16 DIAGNOSIS — Z23 Encounter for immunization: Secondary | ICD-10-CM | POA: Diagnosis not present

## 2024-04-17 NOTE — Progress Notes (Unsigned)
 Patient is in office today for a nurse visit for Immunization. Patient Injection was given in the  Left arm. Patient tolerated injection well.

## 2024-05-14 DIAGNOSIS — Z1231 Encounter for screening mammogram for malignant neoplasm of breast: Secondary | ICD-10-CM | POA: Diagnosis not present

## 2024-05-15 DIAGNOSIS — M1711 Unilateral primary osteoarthritis, right knee: Secondary | ICD-10-CM | POA: Diagnosis not present

## 2024-05-18 ENCOUNTER — Encounter: Payer: Self-pay | Admitting: Family Medicine

## 2024-05-18 LAB — HM MAMMOGRAPHY

## 2024-06-21 LAB — OPHTHALMOLOGY REPORT-SCANNED

## 2025-04-29 ENCOUNTER — Encounter: Admitting: Family Medicine
